# Patient Record
Sex: Female | Born: 1955 | Race: Black or African American | Hispanic: No | Smoking: Never smoker
Health system: Southern US, Community
[De-identification: ages and names within clinical notes are randomized; demographics above are authoritative.]

## PROBLEM LIST (undated history)

## (undated) DIAGNOSIS — F419 Anxiety disorder, unspecified: Secondary | ICD-10-CM

## (undated) DIAGNOSIS — E785 Hyperlipidemia, unspecified: Secondary | ICD-10-CM

## (undated) DIAGNOSIS — Z9889 Other specified postprocedural states: Secondary | ICD-10-CM

## (undated) DIAGNOSIS — C801 Malignant (primary) neoplasm, unspecified: Secondary | ICD-10-CM

## (undated) DIAGNOSIS — R112 Nausea with vomiting, unspecified: Secondary | ICD-10-CM

## (undated) DIAGNOSIS — J189 Pneumonia, unspecified organism: Secondary | ICD-10-CM

## (undated) DIAGNOSIS — M1990 Unspecified osteoarthritis, unspecified site: Secondary | ICD-10-CM

## (undated) DIAGNOSIS — K219 Gastro-esophageal reflux disease without esophagitis: Secondary | ICD-10-CM

## (undated) DIAGNOSIS — I10 Essential (primary) hypertension: Secondary | ICD-10-CM

## (undated) DIAGNOSIS — Z5189 Encounter for other specified aftercare: Secondary | ICD-10-CM

## (undated) HISTORY — DX: Encounter for other specified aftercare: Z51.89

## (undated) HISTORY — DX: Malignant (primary) neoplasm, unspecified: C80.1

## (undated) HISTORY — PX: BREAST SURGERY: SHX581

## (undated) HISTORY — PX: COLONOSCOPY: SHX174

## (undated) SURGERY — CYST REMOVAL
Anesthesia: LOCAL | Laterality: Left

---

## 2006-01-02 ENCOUNTER — Encounter: Admission: RE | Admit: 2006-01-02 | Discharge: 2006-01-02 | Payer: Self-pay | Admitting: Radiology

## 2006-01-05 ENCOUNTER — Ambulatory Visit (HOSPITAL_COMMUNITY): Admission: RE | Admit: 2006-01-05 | Discharge: 2006-01-05 | Payer: Self-pay | Admitting: General Surgery

## 2006-01-09 ENCOUNTER — Encounter (INDEPENDENT_AMBULATORY_CARE_PROVIDER_SITE_OTHER): Payer: Self-pay | Admitting: *Deleted

## 2006-01-09 ENCOUNTER — Ambulatory Visit (HOSPITAL_COMMUNITY): Admission: AD | Admit: 2006-01-09 | Discharge: 2006-01-10 | Payer: Self-pay | Admitting: General Surgery

## 2006-01-09 ENCOUNTER — Encounter (INDEPENDENT_AMBULATORY_CARE_PROVIDER_SITE_OTHER): Payer: Self-pay | Admitting: General Surgery

## 2006-01-14 ENCOUNTER — Emergency Department (HOSPITAL_COMMUNITY): Admission: EM | Admit: 2006-01-14 | Discharge: 2006-01-14 | Payer: Self-pay | Admitting: Emergency Medicine

## 2006-01-15 ENCOUNTER — Ambulatory Visit: Payer: Self-pay | Admitting: Oncology

## 2006-01-24 LAB — COMPREHENSIVE METABOLIC PANEL
ALT: 11 U/L (ref 0–40)
AST: 16 U/L (ref 0–37)
Albumin: 4.3 g/dL (ref 3.5–5.2)
Alkaline Phosphatase: 57 U/L (ref 39–117)
BUN: 11 mg/dL (ref 6–23)
CO2: 28 mEq/L (ref 19–32)
Calcium: 9.8 mg/dL (ref 8.4–10.5)
Chloride: 103 mEq/L (ref 96–112)
Creatinine, Ser: 0.6 mg/dL (ref 0.4–1.2)
Glucose, Bld: 85 mg/dL (ref 70–99)
Potassium: 4 mEq/L (ref 3.5–5.3)
Sodium: 142 mEq/L (ref 135–145)
Total Bilirubin: 0.4 mg/dL (ref 0.3–1.2)
Total Protein: 7.3 g/dL (ref 6.0–8.3)

## 2006-01-24 LAB — CBC WITH DIFFERENTIAL/PLATELET
BASO%: 0.4 % (ref 0.0–2.0)
Basophils Absolute: 0 10*3/uL (ref 0.0–0.1)
EOS%: 1.6 % (ref 0.0–7.0)
Eosinophils Absolute: 0.1 10*3/uL (ref 0.0–0.5)
HCT: 34.8 % (ref 34.8–46.6)
HGB: 11.9 g/dL (ref 11.6–15.9)
LYMPH%: 27.1 % (ref 14.0–48.0)
MCH: 30.1 pg (ref 26.0–34.0)
MCHC: 34.3 g/dL (ref 32.0–36.0)
MCV: 87.7 fL (ref 81.0–101.0)
MONO#: 0.4 10*3/uL (ref 0.1–0.9)
MONO%: 4.3 % (ref 0.0–13.0)
NEUT#: 6 10*3/uL (ref 1.5–6.5)
NEUT%: 66.6 % (ref 39.6–76.8)
Platelets: 323 10*3/uL (ref 145–400)
RBC: 3.96 10*6/uL (ref 3.70–5.32)
RDW: 13.1 % (ref 11.3–14.5)
WBC: 9 10*3/uL (ref 3.9–10.0)
lymph#: 2.4 10*3/uL (ref 0.9–3.3)

## 2006-01-24 LAB — CANCER ANTIGEN 27.29: CA 27.29: 27 U/mL (ref 0–39)

## 2006-01-24 LAB — LACTATE DEHYDROGENASE: LDH: 156 U/L (ref 94–250)

## 2006-01-26 ENCOUNTER — Ambulatory Visit (HOSPITAL_COMMUNITY): Admission: RE | Admit: 2006-01-26 | Discharge: 2006-01-26 | Payer: Self-pay | Admitting: Oncology

## 2006-01-31 ENCOUNTER — Ambulatory Visit (HOSPITAL_COMMUNITY): Admission: RE | Admit: 2006-01-31 | Discharge: 2006-01-31 | Payer: Self-pay | Admitting: Oncology

## 2006-02-05 ENCOUNTER — Ambulatory Visit (HOSPITAL_COMMUNITY): Admission: RE | Admit: 2006-02-05 | Discharge: 2006-02-05 | Payer: Self-pay | Admitting: General Surgery

## 2006-02-08 LAB — CBC WITH DIFFERENTIAL/PLATELET
BASO%: 0.6 % (ref 0.0–2.0)
Basophils Absolute: 0.1 10*3/uL (ref 0.0–0.1)
EOS%: 1.9 % (ref 0.0–7.0)
Eosinophils Absolute: 0.2 10*3/uL (ref 0.0–0.5)
HCT: 37.3 % (ref 34.8–46.6)
HGB: 13.1 g/dL (ref 11.6–15.9)
LYMPH%: 29 % (ref 14.0–48.0)
MCH: 30.2 pg (ref 26.0–34.0)
MCHC: 35 g/dL (ref 32.0–36.0)
MCV: 86.2 fL (ref 81.0–101.0)
MONO#: 0.6 10*3/uL (ref 0.1–0.9)
MONO%: 6.1 % (ref 0.0–13.0)
NEUT#: 5.8 10*3/uL (ref 1.5–6.5)
NEUT%: 62.4 % (ref 39.6–76.8)
Platelets: 283 10*3/uL (ref 145–400)
RBC: 4.33 10*6/uL (ref 3.70–5.32)
RDW: 12.4 % (ref 11.3–14.5)
WBC: 9.3 10*3/uL (ref 3.9–10.0)
lymph#: 2.7 10*3/uL (ref 0.9–3.3)

## 2006-02-16 LAB — CBC WITH DIFFERENTIAL/PLATELET
BASO%: 1.2 % (ref 0.0–2.0)
Basophils Absolute: 0.2 10*3/uL — ABNORMAL HIGH (ref 0.0–0.1)
EOS%: 0.3 % (ref 0.0–7.0)
Eosinophils Absolute: 0 10*3/uL (ref 0.0–0.5)
HCT: 37.6 % (ref 34.8–46.6)
HGB: 13.4 g/dL (ref 11.6–15.9)
LYMPH%: 22.5 % (ref 14.0–48.0)
MCH: 30.2 pg (ref 26.0–34.0)
MCHC: 35.7 g/dL (ref 32.0–36.0)
MCV: 84.5 fL (ref 81.0–101.0)
MONO#: 1.1 10*3/uL — ABNORMAL HIGH (ref 0.1–0.9)
MONO%: 6.1 % (ref 0.0–13.0)
NEUT#: 12.2 10*3/uL — ABNORMAL HIGH (ref 1.5–6.5)
NEUT%: 70 % (ref 39.6–76.8)
Platelets: 261 10*3/uL (ref 145–400)
RBC: 4.45 10*6/uL (ref 3.70–5.32)
RDW: 11.5 % (ref 11.3–14.5)
WBC: 17.5 10*3/uL — ABNORMAL HIGH (ref 3.9–10.0)
lymph#: 3.9 10*3/uL — ABNORMAL HIGH (ref 0.9–3.3)

## 2006-02-22 LAB — CBC WITH DIFFERENTIAL/PLATELET
BASO%: 0.7 % (ref 0.0–2.0)
Basophils Absolute: 0.1 10*3/uL (ref 0.0–0.1)
EOS%: 0.1 % (ref 0.0–7.0)
Eosinophils Absolute: 0 10*3/uL (ref 0.0–0.5)
HCT: 32.6 % — ABNORMAL LOW (ref 34.8–46.6)
HGB: 11.6 g/dL (ref 11.6–15.9)
LYMPH%: 21.5 % (ref 14.0–48.0)
MCH: 30.6 pg (ref 26.0–34.0)
MCHC: 35.4 g/dL (ref 32.0–36.0)
MCV: 86.4 fL (ref 81.0–101.0)
MONO#: 0.5 10*3/uL (ref 0.1–0.9)
MONO%: 4.4 % (ref 0.0–13.0)
NEUT#: 8.4 10*3/uL — ABNORMAL HIGH (ref 1.5–6.5)
NEUT%: 73.3 % (ref 39.6–76.8)
Platelets: 262 10*3/uL (ref 145–400)
RBC: 3.77 10*6/uL (ref 3.70–5.32)
RDW: 12.3 % (ref 11.3–14.5)
WBC: 11.5 10*3/uL — ABNORMAL HIGH (ref 3.9–10.0)
lymph#: 2.5 10*3/uL (ref 0.9–3.3)

## 2006-02-28 LAB — CBC WITH DIFFERENTIAL/PLATELET
BASO%: 0.6 % (ref 0.0–2.0)
Basophils Absolute: 0.1 10*3/uL (ref 0.0–0.1)
EOS%: 0.4 % (ref 0.0–7.0)
Eosinophils Absolute: 0 10*3/uL (ref 0.0–0.5)
HCT: 32 % — ABNORMAL LOW (ref 34.8–46.6)
HGB: 11.4 g/dL — ABNORMAL LOW (ref 11.6–15.9)
LYMPH%: 21.3 % (ref 14.0–48.0)
MCH: 30.5 pg (ref 26.0–34.0)
MCHC: 35.7 g/dL (ref 32.0–36.0)
MCV: 85.4 fL (ref 81.0–101.0)
MONO#: 0.8 10*3/uL (ref 0.1–0.9)
MONO%: 5.9 % (ref 0.0–13.0)
NEUT#: 9.7 10*3/uL — ABNORMAL HIGH (ref 1.5–6.5)
NEUT%: 71.8 % (ref 39.6–76.8)
Platelets: 274 10*3/uL (ref 145–400)
RBC: 3.75 10*6/uL (ref 3.70–5.32)
RDW: 12.5 % (ref 11.3–14.5)
WBC: 13.4 10*3/uL — ABNORMAL HIGH (ref 3.9–10.0)
lymph#: 2.9 10*3/uL (ref 0.9–3.3)

## 2006-03-02 ENCOUNTER — Ambulatory Visit: Payer: Self-pay | Admitting: Oncology

## 2006-03-07 LAB — CBC WITH DIFFERENTIAL/PLATELET
BASO%: 0.2 % (ref 0.0–2.0)
Basophils Absolute: 0 10*3/uL (ref 0.0–0.1)
EOS%: 0.1 % (ref 0.0–7.0)
Eosinophils Absolute: 0 10*3/uL (ref 0.0–0.5)
HCT: 37.5 % (ref 34.8–46.6)
HGB: 12.8 g/dL (ref 11.6–15.9)
LYMPH%: 21.6 % (ref 14.0–48.0)
MCH: 30 pg (ref 26.0–34.0)
MCHC: 34.2 g/dL (ref 32.0–36.0)
MCV: 87.7 fL (ref 81.0–101.0)
MONO#: 0.2 10*3/uL (ref 0.1–0.9)
MONO%: 1.1 % (ref 0.0–13.0)
NEUT#: 10.5 10*3/uL — ABNORMAL HIGH (ref 1.5–6.5)
NEUT%: 77 % — ABNORMAL HIGH (ref 39.6–76.8)
Platelets: 183 10*3/uL (ref 145–400)
RBC: 4.28 10*6/uL (ref 3.70–5.32)
RDW: 13.2 % (ref 11.3–14.5)
WBC: 13.6 10*3/uL — ABNORMAL HIGH (ref 3.9–10.0)
lymph#: 2.9 10*3/uL (ref 0.9–3.3)

## 2006-03-22 LAB — CBC WITH DIFFERENTIAL/PLATELET
BASO%: 0.1 % (ref 0.0–2.0)
Basophils Absolute: 0 10*3/uL (ref 0.0–0.1)
EOS%: 0 % (ref 0.0–7.0)
Eosinophils Absolute: 0 10*3/uL (ref 0.0–0.5)
HCT: 33.1 % — ABNORMAL LOW (ref 34.8–46.6)
HGB: 11.6 g/dL (ref 11.6–15.9)
LYMPH%: 8 % — ABNORMAL LOW (ref 14.0–48.0)
MCH: 30.3 pg (ref 26.0–34.0)
MCHC: 35 g/dL (ref 32.0–36.0)
MCV: 86.5 fL (ref 81.0–101.0)
MONO#: 0.1 10*3/uL (ref 0.1–0.9)
MONO%: 0.8 % (ref 0.0–13.0)
NEUT#: 11.2 10*3/uL — ABNORMAL HIGH (ref 1.5–6.5)
NEUT%: 91.1 % — ABNORMAL HIGH (ref 39.6–76.8)
Platelets: 253 10*3/uL (ref 145–400)
RBC: 3.83 10*6/uL (ref 3.70–5.32)
RDW: 13.9 % (ref 11.3–14.5)
WBC: 12.3 10*3/uL — ABNORMAL HIGH (ref 3.9–10.0)
lymph#: 1 10*3/uL (ref 0.9–3.3)

## 2006-03-30 LAB — CBC WITH DIFFERENTIAL/PLATELET
BASO%: 1.1 % (ref 0.0–2.0)
Basophils Absolute: 0.1 10*3/uL (ref 0.0–0.1)
EOS%: 0.1 % (ref 0.0–7.0)
Eosinophils Absolute: 0 10*3/uL (ref 0.0–0.5)
HCT: 33.5 % — ABNORMAL LOW (ref 34.8–46.6)
HGB: 11.5 g/dL — ABNORMAL LOW (ref 11.6–15.9)
LYMPH%: 34.9 % (ref 14.0–48.0)
MCH: 29.9 pg (ref 26.0–34.0)
MCHC: 34.2 g/dL (ref 32.0–36.0)
MCV: 87.5 fL (ref 81.0–101.0)
MONO#: 0.6 10*3/uL (ref 0.1–0.9)
MONO%: 7.6 % (ref 0.0–13.0)
NEUT#: 4.3 10*3/uL (ref 1.5–6.5)
NEUT%: 56.4 % (ref 39.6–76.8)
Platelets: 269 10*3/uL (ref 145–400)
RBC: 3.83 10*6/uL (ref 3.70–5.32)
RDW: 13.5 % (ref 11.3–14.5)
WBC: 7.6 10*3/uL (ref 3.9–10.0)
lymph#: 2.7 10*3/uL (ref 0.9–3.3)

## 2006-04-06 LAB — CBC WITH DIFFERENTIAL/PLATELET
BASO%: 0.4 % (ref 0.0–2.0)
Basophils Absolute: 0 10*3/uL (ref 0.0–0.1)
EOS%: 0 % (ref 0.0–7.0)
Eosinophils Absolute: 0 10*3/uL (ref 0.0–0.5)
HCT: 32.3 % — ABNORMAL LOW (ref 34.8–46.6)
HGB: 11 g/dL — ABNORMAL LOW (ref 11.6–15.9)
LYMPH%: 24.2 % (ref 14.0–48.0)
MCH: 30.1 pg (ref 26.0–34.0)
MCHC: 34.2 g/dL (ref 32.0–36.0)
MCV: 88 fL (ref 81.0–101.0)
MONO#: 0.4 10*3/uL (ref 0.1–0.9)
MONO%: 4.9 % (ref 0.0–13.0)
NEUT#: 5.6 10*3/uL (ref 1.5–6.5)
NEUT%: 70.5 % (ref 39.6–76.8)
Platelets: 273 10*3/uL (ref 145–400)
RBC: 3.67 10*6/uL — ABNORMAL LOW (ref 3.70–5.32)
RDW: 15.7 % — ABNORMAL HIGH (ref 11.3–14.5)
WBC: 8 10*3/uL (ref 3.9–10.0)
lymph#: 1.9 10*3/uL (ref 0.9–3.3)

## 2006-04-06 LAB — COMPREHENSIVE METABOLIC PANEL
ALT: 26 U/L (ref 0–40)
AST: 16 U/L (ref 0–37)
Albumin: 4.2 g/dL (ref 3.5–5.2)
Alkaline Phosphatase: 82 U/L (ref 39–117)
BUN: 7 mg/dL (ref 6–23)
CO2: 26 mEq/L (ref 19–32)
Calcium: 8.9 mg/dL (ref 8.4–10.5)
Chloride: 104 mEq/L (ref 96–112)
Creatinine, Ser: 0.51 mg/dL (ref 0.40–1.20)
Glucose, Bld: 89 mg/dL (ref 70–99)
Potassium: 3.7 mEq/L (ref 3.5–5.3)
Sodium: 141 mEq/L (ref 135–145)
Total Bilirubin: 0.5 mg/dL (ref 0.3–1.2)
Total Protein: 6.7 g/dL (ref 6.0–8.3)

## 2006-04-06 LAB — CANCER ANTIGEN 27.29: CA 27.29: 41 U/mL — ABNORMAL HIGH (ref 0–39)

## 2006-04-13 ENCOUNTER — Ambulatory Visit: Payer: Self-pay | Admitting: Oncology

## 2006-04-13 LAB — CBC WITH DIFFERENTIAL/PLATELET
BASO%: 0.3 % (ref 0.0–2.0)
Basophils Absolute: 0.1 10*3/uL (ref 0.0–0.1)
EOS%: 0 % (ref 0.0–7.0)
Eosinophils Absolute: 0 10*3/uL (ref 0.0–0.5)
HCT: 33.3 % — ABNORMAL LOW (ref 34.8–46.6)
HGB: 11.6 g/dL (ref 11.6–15.9)
LYMPH%: 9.8 % — ABNORMAL LOW (ref 14.0–48.0)
MCH: 30.6 pg (ref 26.0–34.0)
MCHC: 34.9 g/dL (ref 32.0–36.0)
MCV: 87.7 fL (ref 81.0–101.0)
MONO#: 0.8 10*3/uL (ref 0.1–0.9)
MONO%: 4.1 % (ref 0.0–13.0)
NEUT#: 16.6 10*3/uL — ABNORMAL HIGH (ref 1.5–6.5)
NEUT%: 85.8 % — ABNORMAL HIGH (ref 39.6–76.8)
Platelets: 301 10*3/uL (ref 145–400)
RBC: 3.79 10*6/uL (ref 3.70–5.32)
RDW: 14.6 % — ABNORMAL HIGH (ref 11.3–14.5)
WBC: 19.4 10*3/uL — ABNORMAL HIGH (ref 3.9–10.0)
lymph#: 1.9 10*3/uL (ref 0.9–3.3)

## 2006-05-18 ENCOUNTER — Encounter: Payer: Self-pay | Admitting: Vascular Surgery

## 2006-05-18 ENCOUNTER — Ambulatory Visit (HOSPITAL_COMMUNITY): Admission: RE | Admit: 2006-05-18 | Discharge: 2006-05-18 | Payer: Self-pay | Admitting: Oncology

## 2006-05-18 LAB — CANCER ANTIGEN 27.29: CA 27.29: 43 U/mL — ABNORMAL HIGH (ref 0–39)

## 2006-05-18 LAB — CBC WITH DIFFERENTIAL/PLATELET
BASO%: 0.2 % (ref 0.0–2.0)
Basophils Absolute: 0 10*3/uL (ref 0.0–0.1)
EOS%: 0.9 % (ref 0.0–7.0)
Eosinophils Absolute: 0.1 10*3/uL (ref 0.0–0.5)
HCT: 33 % — ABNORMAL LOW (ref 34.8–46.6)
HGB: 11.4 g/dL — ABNORMAL LOW (ref 11.6–15.9)
LYMPH%: 31 % (ref 14.0–48.0)
MCH: 31.3 pg (ref 26.0–34.0)
MCHC: 34.4 g/dL (ref 32.0–36.0)
MCV: 90.8 fL (ref 81.0–101.0)
MONO#: 0.6 10*3/uL (ref 0.1–0.9)
MONO%: 8.6 % (ref 0.0–13.0)
NEUT#: 3.8 10*3/uL (ref 1.5–6.5)
NEUT%: 59.3 % (ref 39.6–76.8)
Platelets: 361 10*3/uL (ref 145–400)
RBC: 3.64 10*6/uL — ABNORMAL LOW (ref 3.70–5.32)
RDW: 17.1 % — ABNORMAL HIGH (ref 11.3–14.5)
WBC: 6.4 10*3/uL (ref 3.9–10.0)
lymph#: 2 10*3/uL (ref 0.9–3.3)

## 2006-05-18 LAB — COMPREHENSIVE METABOLIC PANEL
ALT: 14 U/L (ref 0–40)
AST: 16 U/L (ref 0–37)
Albumin: 4.4 g/dL (ref 3.5–5.2)
Alkaline Phosphatase: 59 U/L (ref 39–117)
BUN: 12 mg/dL (ref 6–23)
CO2: 30 mEq/L (ref 19–32)
Calcium: 9.6 mg/dL (ref 8.4–10.5)
Chloride: 102 mEq/L (ref 96–112)
Creatinine, Ser: 0.64 mg/dL (ref 0.40–1.20)
Glucose, Bld: 103 mg/dL — ABNORMAL HIGH (ref 70–99)
Potassium: 4.4 mEq/L (ref 3.5–5.3)
Sodium: 142 mEq/L (ref 135–145)
Total Bilirubin: 0.6 mg/dL (ref 0.3–1.2)
Total Protein: 6.7 g/dL (ref 6.0–8.3)

## 2006-06-26 ENCOUNTER — Ambulatory Visit: Payer: Self-pay | Admitting: Oncology

## 2006-06-28 LAB — CBC WITH DIFFERENTIAL/PLATELET
BASO%: 0.4 % (ref 0.0–2.0)
Basophils Absolute: 0 10*3/uL (ref 0.0–0.1)
EOS%: 1.5 % (ref 0.0–7.0)
Eosinophils Absolute: 0.1 10*3/uL (ref 0.0–0.5)
HCT: 34.6 % — ABNORMAL LOW (ref 34.8–46.6)
HGB: 11.9 g/dL (ref 11.6–15.9)
LYMPH%: 31.5 % (ref 14.0–48.0)
MCH: 30.6 pg (ref 26.0–34.0)
MCHC: 34.4 g/dL (ref 32.0–36.0)
MCV: 89 fL (ref 81.0–101.0)
MONO#: 0.4 10*3/uL (ref 0.1–0.9)
MONO%: 6.1 % (ref 0.0–13.0)
NEUT#: 4.3 10*3/uL (ref 1.5–6.5)
NEUT%: 60.5 % (ref 39.6–76.8)
Platelets: 253 10*3/uL (ref 145–400)
RBC: 3.89 10*6/uL (ref 3.70–5.32)
RDW: 13.4 % (ref 11.3–14.5)
WBC: 7.1 10*3/uL (ref 3.9–10.0)
lymph#: 2.2 10*3/uL (ref 0.9–3.3)

## 2006-06-28 LAB — CANCER ANTIGEN 27.29: CA 27.29: 27 U/mL (ref 0–39)

## 2006-08-06 ENCOUNTER — Ambulatory Visit (HOSPITAL_COMMUNITY): Admission: RE | Admit: 2006-08-06 | Discharge: 2006-08-06 | Payer: Self-pay | Admitting: Oncology

## 2006-08-09 ENCOUNTER — Ambulatory Visit: Payer: Self-pay | Admitting: Oncology

## 2007-01-21 ENCOUNTER — Ambulatory Visit (HOSPITAL_BASED_OUTPATIENT_CLINIC_OR_DEPARTMENT_OTHER): Admission: RE | Admit: 2007-01-21 | Discharge: 2007-01-21 | Payer: Self-pay | Admitting: General Surgery

## 2009-01-08 ENCOUNTER — Encounter: Admission: RE | Admit: 2009-01-08 | Discharge: 2009-01-08 | Payer: Self-pay | Admitting: Family Medicine

## 2010-11-13 ENCOUNTER — Encounter: Payer: Self-pay | Admitting: Radiology

## 2011-03-10 NOTE — Op Note (Signed)
NAMERODINA, PINALES             ACCOUNT NO.:  [id number]   MEDICAL RECORD NO.:  [id number]          PATIENT TYPE:  AMB   LOCATION:  DAY                          FACILITY:  [facility]   PHYSICIAN:  Sharlet Salina T. Hoxworth, M.D.DATE OF BIRTH:  Mar 17, 1956   DATE OF PROCEDURE:  02/05/2006  DATE OF DISCHARGE:                                 OPERATIVE REPORT   PRE AND POSTOPERATIVE DIAGNOSIS:  Cancer of breast with poor venous access.   SURGICAL PROCEDURES:  Placement of Life Port to the left subclavian vein.   SURGEON:  Lorne Skeens. Hoxworth, M.D.   ANESTHESIA:  Local with IV sedation.   BRIEF HISTORY:  Ms. Drawdy is a 55 year old female with a recent diagnosis  of breast cancer who will require long-term venous access for chemotherapy.  She has very poor peripheral access.  Placement of subcutaneous venous port  has been recommended and accepted.  The nature of the procedure,  indications, risks of bleeding, infection, pneumothorax, thrombosis,  catheter displacement and malfunction have been discussed and understood.  She is now brought to the operating room for this procedure.   DESCRIPTION OF PROCEDURE:  The patient was brought to the operating room and  placed supine position on operating table.  IV sedation was administered.  The left neck, chest and shoulder were sterilely prepped and draped.  She  received preoperative IV antibiotics.  Correct patient procedure were  verified.  Local anesthesia was used infiltrate the infraclavicular area on  the left and the left subclavian vein cannulated with a needle and guidewire  confirmed by fluoroscopy.  The introducer was then easily passed over the  guidewire and the guidewire removed and the flushed catheter placed via the  introducer which was stripped away.  The catheter tip was positioned in the  superior vena cava by fluoroscopy.  The patient had a previous port on the  left chest and the same incision was used under local anesthesia  and  dissection carried down the subcutaneous tissue and a small subcutaneous  pocket created.  The catheter was tunneled subcutaneously to the pocket,  trimmed to length and attached to the port which was then sutured the  anterior chest wall with interrupted 2-0 Prolene.  Skin incisions were  closed interrupted subcuticular 4-0 Monocryl and Steri-Strips.  The catheter  was accessed, flushed and aspirated easily, was left flushed with  concentrated heparin.  Sponge, needle and instrument counts were correct.  Dry sterile dressings applied.  The patient taken recovery in good  condition.      Lorne Skeens. Hoxworth, M.D.  Electronically Signed     BTH/MEDQ  D:  02/05/2006  T:  02/06/2006  Job:  [id number]

## 2011-03-10 NOTE — Op Note (Signed)
NAMEDAMONIE, FURNEY             ACCOUNT NO.:  [id number]   MEDICAL RECORD NO.:  [id number]          PATIENT TYPE:  OIB   LOCATION:  5742                         FACILITY:  [facility]   PHYSICIAN:  Sharlet Salina T. Hoxworth, M.D.DATE OF BIRTH:  Jul 11, 1956   DATE OF PROCEDURE:  01/09/2006  DATE OF DISCHARGE:                                 OPERATIVE REPORT   PREOPERATIVE DIAGNOSIS:  Carcinoma right breast.   POSTOPERATIVE DIAGNOSIS:  Carcinoma right breast.   SURGICAL PROCEDURES:  Right total mastectomy.   SURGEON:  Lorne Skeens. Hoxworth, M.D.   ANESTHESIA:  General.   BRIEF HISTORY:  Amy Guzman is a 55 year old female who has a history of  previous right breast cancer treated with lumpectomy, full axillary  dissection, radiotherapy and chemotherapy 10 years ago. She had had no  evidence of local or distant recurrence on long term follow-up and had been  receiving routine screening mammograms. Recent screening mammogram showed a  new small mass in the lower inner quadrant of the right breast and large  core needle biopsy has revealed poorly differentiated invasive mammary  carcinoma. Due to her history of previous lumpectomy and radiation with  axillary dissection, we have recommended proceeding with right total  mastectomy. She has declined reconstruction at this time. The nature of the  procedure, indications, risks of bleeding, infection, wound healing problems  were discussed and understood. She is now brought to the operating room for  this procedure.   DESCRIPTION OF OPERATION:  The patient was brought to the operating room,  placed in supine position on the operating table and general endotracheal  anesthesia was induced. Her arm was carefully positioned, extended and the  right arm, neck and chest were widely sterilely prepped and draped. She had  received preoperative IV antibiotics. Correct patient and procedure were  verified. An elliptical incision was used encompassing  her previous fairly  large and high transverse breast incision superiorly and down to the core  biopsy site inferiorly which was inferiorly in the inner breast. Skin and  subcutaneous flaps were then developed superiorly up the clavicle, medially  to the edge of the sternum, inferiorly to the origin of the rectus sheath  and laterally out to the anterior border of the latissimus dorsi. The breast  was then reflected up off of the chest wall with cautery. Some perforating  vessels were suture ligated. Dissection continued out laterally off of the  serratus and the lateral edge of the pectoralis was freed. Further  attachment along the serratus and latissimus were freed with cautery and the  dissection was carried up toward the axilla completely freeing the  pectoralis major and minor and dissecting up to the lower level of the  previous full axillary dissection. The specimen was divided at this point  between clamps and with cautery and removed and these tied with 3-0 silks.  The wound was then copiously irrigated and hemostasis assured. A closed  suction drain was left through a separate stab wound and placed under the  flaps and through the axilla. The skin was  then closed with staples. There was some  tension but not excessive due to  the incision encompassing the previous excision site and core biopsy sites.  A dry sterile dressing was applied. Sponge, needle and instrument counts  were correct. The patient was taken to recovery in good condition.      Lorne Skeens. Hoxworth, M.D.  Electronically Signed     BTH/MEDQ  D:  01/09/2006  T:  01/10/2006  Job:  [id number]

## 2012-08-09 ENCOUNTER — Other Ambulatory Visit: Payer: Self-pay | Admitting: Ophthalmology

## 2012-08-09 ENCOUNTER — Encounter (HOSPITAL_BASED_OUTPATIENT_CLINIC_OR_DEPARTMENT_OTHER): Payer: Self-pay | Admitting: *Deleted

## 2012-08-09 ENCOUNTER — Ambulatory Visit (HOSPITAL_BASED_OUTPATIENT_CLINIC_OR_DEPARTMENT_OTHER)
Admission: RE | Admit: 2012-08-09 | Discharge: 2012-08-09 | Disposition: A | Discharge status: Home / Self Care | Payer: BC Managed Care – PPO | Source: Ambulatory Visit | Attending: Ophthalmology | Admitting: Ophthalmology

## 2012-08-09 ENCOUNTER — Encounter (HOSPITAL_BASED_OUTPATIENT_CLINIC_OR_DEPARTMENT_OTHER)
Admission: RE | Disposition: A | Discharge status: Home / Self Care | Payer: Self-pay | Source: Ambulatory Visit | Attending: Ophthalmology

## 2012-08-09 ENCOUNTER — Encounter: Payer: Self-pay | Admitting: Ophthalmology

## 2012-08-09 DIAGNOSIS — H02829 Cysts of unspecified eye, unspecified eyelid: Secondary | ICD-10-CM | POA: Insufficient documentation

## 2012-08-09 DIAGNOSIS — D2310 Other benign neoplasm of skin of unspecified eyelid, including canthus: Secondary | ICD-10-CM | POA: Insufficient documentation

## 2012-08-09 HISTORY — DX: Hyperlipidemia, unspecified: E78.5

## 2012-08-09 HISTORY — DX: Essential (primary) hypertension: I10

## 2012-08-09 HISTORY — PX: CHALAZION EXCISION: SHX213

## 2012-08-09 SURGERY — MINOR EXCISION OF CHALAZION
Anesthesia: LOCAL | Site: Eye | Laterality: Right | Wound class: Clean Contaminated

## 2012-08-09 MED ORDER — LIDOCAINE HCL 1 % IJ SOLN
INTRAMUSCULAR | Status: DC | PRN
  Administered 2012-08-09: 3 mL

## 2012-08-09 SURGICAL SUPPLY — 18 items
APPLICATOR COTTON TIP 6IN STRL (MISCELLANEOUS) ×2 IMPLANT
BLADE SURG 11 STRL SS (BLADE) ×2 IMPLANT
CAUTERY EYE LOW TEMP 1300F FIN (OPHTHALMIC RELATED) IMPLANT
CLOTH BEACON ORANGE TIMEOUT ST (SAFETY) ×2 IMPLANT
GAUZE SPONGE 4X4 12PLY STRL LF (GAUZE/BANDAGES/DRESSINGS) ×3 IMPLANT
GLOVE BIO SURGEON STRL SZ7 (GLOVE) ×2 IMPLANT
GLOVE ECLIPSE 7.0 STRL STRAW (GLOVE) ×2 IMPLANT
MARKER SKIN DUAL TIP RULER LAB (MISCELLANEOUS) ×2 IMPLANT
NDL HYPO 25X5/8 SAFETYGLIDE (NEEDLE) ×1 IMPLANT
NDL SAFETY ECLIPSE 18X1.5 (NEEDLE) ×1 IMPLANT
NEEDLE HYPO 18GX1.5 SHARP (NEEDLE) ×2
NEEDLE HYPO 25X5/8 SAFETYGLIDE (NEEDLE) ×2 IMPLANT
PAD ALCOHOL SWAB (MISCELLANEOUS) ×3 IMPLANT
PAD EYE OVAL STERILE LF (GAUZE/BANDAGES/DRESSINGS) ×4 IMPLANT
SUT SILK 6 0 P 1 (SUTURE) IMPLANT
SWABSTICK POVIDONE IODINE SNGL (MISCELLANEOUS) ×4 IMPLANT
SYR CONTROL 10ML LL (SYRINGE) ×2 IMPLANT
TOWEL OR 17X24 6PK STRL BLUE (TOWEL DISPOSABLE) ×2 IMPLANT

## 2012-08-09 NOTE — Brief Op Note (Signed)
08/09/2012  7:25 AM  PATIENT:  Amy Guzman  56 y.o. female  PRE-OPERATIVE DIAGNOSIS:  cyst lower lid right eye  POST-OPERATIVE DIAGNOSIS:  * same *  PROCEDURE:  Excision cyst lower lid right eye  SURGEON:  Surgeon(s) and Role:    Vita Erm., MD - Primary  PHYSICIAN ASSISTANT:   ASSISTANTS: none  ANESTHESIA:   Lidocaine 1%  EBL:     BLOOD ADMINISTERED:none  DRAINS: none   LOCAL MEDICATIONS USED:  LIDOCAINE   SPECIMEN:  No Specimen  DISPOSITION OF SPECIMEN:  N/A  COUNTS:  YES  TOURNIQUET:  * No tourniquets in log *  DICTATION: .Note written in EPIC  PLAN OF CARE: Discharge to home after PACU  PATIENT DISPOSITION:  PACU - hemodynamically stable.   Delay start of Pharmacological VTE agent (>24hrs) due to surgical blood loss or risk of bleeding: not applicable

## 2012-08-09 NOTE — H&P (Signed)
  56 yo female has multiple cystic lesions of the lower lid right eye and the upper and lower lids left eye.  The lesion on the lower lid is larger and seems t o be increasing in size.  The patient is admitted for excision of the larger lesion of the lower lid right eye.

## 2012-08-12 ENCOUNTER — Encounter (HOSPITAL_BASED_OUTPATIENT_CLINIC_OR_DEPARTMENT_OTHER): Payer: Self-pay | Admitting: Ophthalmology

## 2012-08-12 ENCOUNTER — Encounter (HOSPITAL_BASED_OUTPATIENT_CLINIC_OR_DEPARTMENT_OTHER): Payer: Self-pay

## 2012-08-16 NOTE — Op Note (Signed)
Amy Guzman, Amy Guzman            ACCOUNT NO.:  [id number]  MEDICAL RECORD NO.:  [id number]  LOCATION:                                 FACILITY:  PHYSICIAN:  Salley Scarlet., M.D.DATE OF BIRTH:  [birth year]  DATE OF PROCEDURE:  08/09/2012 DATE OF DISCHARGE:                              OPERATIVE REPORT   PREOPERATIVE DIAGNOSIS:  Multiple cysts lower lid, right eye.  POSTOPERATIVE DIAGNOSIS:  Cyst excision lower lid, right eye.  ANESTHESIA:  Local using Xylocaine 1%.  JUSTIFICATION FOR PROCEDURE:  This is a 56 year old lady who has multiple cysts along the lower and upper lid of the right eye.  She also has multiple cysts of the lids of the left eye.  The lesion on the right are becoming more numerous and are increasing in size.  The patient requested that the larger lesion be removed.  She is admitted for the proposed procedure.  PROCEDURE:  The patient was brought to the minor surgery room and she was prepped and draped in the usual manner.  The lid adjacent and surrounding the larger lesion which was located at the lateral aspect of the lower lid of the right eye was infiltrated with several mL of Xylocaine. There were also smaller lesions along the lateral canthal area.  This area was also infiltrated with Xylocaine.  Then the chalazion clamp was applied over the larger lesions laterally for support.  A curvilinear incision was made around this larger lesion, which was located at the lid margin, being careful not to invade the lash line.  The lesion was excised in toto using sharp and blunt dissection.  The skin was reapproximated using interrupted sutures of 6-0 silk.  Then attention was applied to the lateral canthal area.  An elliptical section of skin containing the cyst were excised in toto using sharp and blunt dissection.  The skin was then re approximated using 6 0 silk sutures. All specimen were submitted to Pathology for study.  At the end of procedure,  Polysporin ophthalmic ointment and a pressure patch were applied.  The patient tolerated procedure well and was discharged to the post anesthesia recovery room in a satisfactory condition.  She was instructed to take Vicodin every 4 hours as needed for pain, to remove the patch tomorrow and to apply topical Polysporin ointment to the surgical site twice a day until I see her in the office in 1 week.  DISCHARGE DIAGNOSIS:  Multiple cysts, lower lid right eye.     Salley Scarlet., M.D.     TB/MEDQ  D:  08/16/2012  T:  08/16/2012  Job:  [id number]

## 2012-08-28 NOTE — Progress Notes (Signed)
Left message with preop instructions 30 min early Please call for questions

## 2012-08-30 ENCOUNTER — Other Ambulatory Visit: Payer: Self-pay | Admitting: Ophthalmology

## 2012-08-30 ENCOUNTER — Encounter (HOSPITAL_BASED_OUTPATIENT_CLINIC_OR_DEPARTMENT_OTHER)
Admission: RE | Disposition: A | Discharge status: Home / Self Care | Payer: Self-pay | Source: Ambulatory Visit | Attending: Ophthalmology

## 2012-08-30 ENCOUNTER — Encounter: Payer: Self-pay | Admitting: Ophthalmology

## 2012-08-30 ENCOUNTER — Ambulatory Visit (HOSPITAL_BASED_OUTPATIENT_CLINIC_OR_DEPARTMENT_OTHER)
Admission: RE | Admit: 2012-08-30 | Discharge: 2012-08-30 | Disposition: A | Discharge status: Home / Self Care | Payer: BC Managed Care – PPO | Source: Ambulatory Visit | Attending: Ophthalmology | Admitting: Ophthalmology

## 2012-08-30 DIAGNOSIS — Z79899 Other long term (current) drug therapy: Secondary | ICD-10-CM | POA: Insufficient documentation

## 2012-08-30 DIAGNOSIS — I10 Essential (primary) hypertension: Secondary | ICD-10-CM | POA: Insufficient documentation

## 2012-08-30 DIAGNOSIS — H02829 Cysts of unspecified eye, unspecified eyelid: Secondary | ICD-10-CM | POA: Insufficient documentation

## 2012-08-30 DIAGNOSIS — E7800 Pure hypercholesterolemia, unspecified: Secondary | ICD-10-CM | POA: Insufficient documentation

## 2012-08-30 DIAGNOSIS — H2510 Age-related nuclear cataract, unspecified eye: Secondary | ICD-10-CM | POA: Insufficient documentation

## 2012-08-30 HISTORY — PX: LESION REMOVAL: SHX5196

## 2012-08-30 SURGERY — MINOR EXCISION OF LESION
Anesthesia: LOCAL | Site: Face | Laterality: Left | Wound class: Clean Contaminated

## 2012-08-30 MED ORDER — LIDOCAINE HCL 1 % IJ SOLN
INTRAMUSCULAR | Status: DC | PRN
  Administered 2012-08-30: 4 mL

## 2012-08-30 MED ORDER — BACITRACIN-POLYMYXIN B 500-10000 UNIT/GM OP OINT
TOPICAL_OINTMENT | OPHTHALMIC | Status: DC | PRN
  Administered 2012-08-30: 1 via OPHTHALMIC

## 2012-08-30 SURGICAL SUPPLY — 17 items
APPLICATOR COTTON TIP 6IN STRL (MISCELLANEOUS) ×2 IMPLANT
BLADE SURG 11 STRL SS (BLADE) ×2 IMPLANT
CAUTERY EYE LOW TEMP 1300F FIN (OPHTHALMIC RELATED) IMPLANT
CLOTH BEACON ORANGE TIMEOUT ST (SAFETY) ×2 IMPLANT
GAUZE SPONGE 4X4 12PLY STRL LF (GAUZE/BANDAGES/DRESSINGS) ×2 IMPLANT
GLOVE ECLIPSE 7.0 STRL STRAW (GLOVE) ×3 IMPLANT
MARKER SKIN DUAL TIP RULER LAB (MISCELLANEOUS) ×2 IMPLANT
NDL HYPO 25X5/8 SAFETYGLIDE (NEEDLE) ×1 IMPLANT
NDL SAFETY ECLIPSE 18X1.5 (NEEDLE) ×1 IMPLANT
NEEDLE HYPO 18GX1.5 SHARP (NEEDLE) ×2
NEEDLE HYPO 25X5/8 SAFETYGLIDE (NEEDLE) ×2 IMPLANT
PAD ALCOHOL SWAB (MISCELLANEOUS) ×2 IMPLANT
PAD EYE OVAL STERILE LF (GAUZE/BANDAGES/DRESSINGS) ×4 IMPLANT
SUT SILK 6 0 P 1 (SUTURE) ×1 IMPLANT
SWABSTICK POVIDONE IODINE SNGL (MISCELLANEOUS) ×4 IMPLANT
SYR CONTROL 10ML LL (SYRINGE) ×2 IMPLANT
TOWEL OR 17X24 6PK STRL BLUE (TOWEL DISPOSABLE) ×2 IMPLANT

## 2012-08-30 NOTE — H&P (Signed)
  56 yo female has multiple cysts upper and lower lids left eye.  These lesions seem to be increasing in size.  She is admitted for cyst excisions lids left eye.  She underwent a similar procedure for excision cysts right eye 2-3 weeks ago.

## 2012-08-30 NOTE — Brief Op Note (Signed)
08/30/2012  7:27 AM  PATIENT:  Amy Guzman  56 y.o. female  PRE-OPERATIVE DIAGNOSIS: multiple cysts upper and lower lids left eye  POST-OPERATIVE DIAGNOSIS:  * Same *  PROCEDURE:  Procedure(s) (LRB) with comments: MINOR EXICISION OF LESION (Right)  SURGEON:  Surgeon(s) and Role:    Vita Erm., MD - Primary  PHYSICIAN ASSISTANT:   ASSISTANTS: none   ANESTHESIA:   local and regional  EBL:     BLOOD ADMINISTERED:none  DRAINS:   LOCAL MEDICATIONS USED:  LIDOCAINE   SPECIMEN:  Excision  DISPOSITION OF SPECIMEN:  N/A  COUNTS:  YES  TOURNIQUET:  * No tourniquets in log *  DICTATION: .Other Dictation: Dictation Number [id number]  PLAN OF CARE: Discharge to home after PACU  PATIENT DISPOSITION:  Short Stay   Delay start of Pharmacological VTE agent (>24hrs) due to surgical blood loss or risk of bleeding: not applicable

## 2012-08-31 NOTE — Brief Op Note (Signed)
08/30/2012  8:17 AM  PATIENT:  Amy Guzman  56 y.o. female  PRE-OPERATIVE DIAGNOSIS:  cyst lower lid right eye  POST-OPERATIVE DIAGNOSIS:  * No post-op diagnosis entered *  PROCEDURE:  Procedure(s) (LRB) with comments: MINOR EXICISION OF LESION (Left)  SURGEON:  Surgeon(s) and Role:    Vita Erm., MD - Primary  PHYSICIAN ASSISTANT:   ASSISTANTS: none   ANESTHESIA:   local  EBL:     BLOOD ADMINISTERED:none  DRAINS: none   LOCAL MEDICATIONS USED:  XYLOCAINE   SPECIMEN:  Excision  DISPOSITION OF SPECIMEN:  PATHOLOGY  COUNTS:  YES  TOURNIQUET:  * No tourniquets in log *  DICTATION: .Other Dictation: Dictation Number [id number]  PLAN OF CARE: Discharge to home after PACU  PATIENT DISPOSITION:  Short Stay   Delay start of Pharmacological VTE agent (>24hrs) due to surgical blood loss or risk of bleeding: not applicable

## 2012-09-02 NOTE — Op Note (Signed)
Amy Guzman, Amy Guzman             ACCOUNT NO.:  [id number]  MEDICAL RECORD NO.:  [id number]  LOCATION:                                 FACILITY:  PHYSICIAN:  Salley Scarlet., M.D. DATE OF BIRTH:  DATE OF PROCEDURE: DATE OF DISCHARGE:                              OPERATIVE REPORT   This is a 56 year old lady with multiple cysts of both eyes.  She has had multiple cyst excisions removed from the right eye.  She now has multiple cysts of the upper and lower lids of the left eye.  The lesions have seemed increase in numbness of eyes over the past several months. The patient requested that the lesion would be excised.  She was admitted at this time for excision of multiple cysts, upper and lower lid, left eye.  PROCEDURE:  On influence of local anesthesia with Xylocaine 1%, the patient was prepped and draped in usual manner.  Attention was applied to a small cyst, which was located along the medial canthal area adjacent to the nose.  A curvilinear excision was made around the lesion and the lesion was excised in toto using sharp and blunt dissection. The skin was approximated using interrupted sutures of 6-0 silk.  The attention was then given to the larger lesion, which was located along the lateral aspect of the upper lid of the left eye.  A curvilinear incision was made around the cyst and the lesion was undermined using sharp and blunt dissection.  The cyst was excised in toto using the sharp and blunt dissection.  The skin was reapproximated with interrupted sutures of 6-0 silk.  A smaller lesion adjacent to this larger lesion of the upper lid of the left eye was also excised in a similar fashion.  Attention was then diverted to a cluster of cysts, which were located along the lower lid near the lateral canthus.  This cluster was undermined by first outlining the lesions with curvilinear incision.  It was then excised in toto using the sharp and blunt dissection.  The skin  was reapproximated using interrupted sutures of 6- 0 nylon.  Polysporin ophthalmic ointment was applied to the surgical site.  A pressure patch was applied and the patient was discharged in satisfactory condition.  She was instructed to keep the patch on tightly today to remove it tomorrow morning and to resume her Polysporin ophthalmic ointment to the suture sites twice a day.  She was instructed to see me in 1 week for suture removal.     Salley Scarlet., M.D.     TB/MEDQ  D:  08/31/2012  T:  08/31/2012  Job:  [id number]

## 2012-09-20 ENCOUNTER — Other Ambulatory Visit: Payer: Self-pay | Admitting: Ophthalmology

## 2013-06-02 ENCOUNTER — Telehealth: Payer: Self-pay | Admitting: *Deleted

## 2013-06-02 NOTE — Telephone Encounter (Signed)
Left message for pt to return my call to see if she is wanting to get re-established w/ Dr. Darnelle Catalan.

## 2013-06-02 NOTE — Telephone Encounter (Signed)
Pt returned my call and I confirmed 07/17/13 appt w/ pt.  Mailed before appt letter & packet to pt.  Took paperwork to Med Rec for chart.

## 2013-07-10 ENCOUNTER — Other Ambulatory Visit: Payer: Self-pay | Admitting: *Deleted

## 2013-07-10 DIAGNOSIS — Z853 Personal history of malignant neoplasm of breast: Secondary | ICD-10-CM

## 2013-07-15 ENCOUNTER — Telehealth: Payer: Self-pay | Admitting: *Deleted

## 2013-07-15 NOTE — Telephone Encounter (Signed)
Pt called and left a message for Ezzard Standing stating that she is not able to make her appt on 9/25 due to school and needs to reschedule.  Called and left a message for pt to return my call.

## 2013-07-17 ENCOUNTER — Ambulatory Visit: Payer: BC Managed Care – PPO | Admitting: Oncology

## 2013-07-17 ENCOUNTER — Ambulatory Visit: Payer: BC Managed Care – PPO

## 2013-07-17 ENCOUNTER — Other Ambulatory Visit: Payer: BC Managed Care – PPO | Admitting: Lab

## 2013-08-14 ENCOUNTER — Telehealth: Payer: Self-pay | Admitting: *Deleted

## 2013-08-14 NOTE — Telephone Encounter (Signed)
Left message for pt to return my call if she is wants to come in and see Dr. Darnelle Catalan.

## 2015-07-07 ENCOUNTER — Ambulatory Visit: Payer: BC Managed Care – PPO | Admitting: Podiatry

## 2015-07-21 ENCOUNTER — Encounter: Payer: Self-pay | Admitting: Podiatry

## 2015-07-21 ENCOUNTER — Ambulatory Visit (INDEPENDENT_AMBULATORY_CARE_PROVIDER_SITE_OTHER): Payer: BC Managed Care – PPO | Admitting: Podiatry

## 2015-07-21 VITALS — BP 102/63 | HR 79 | Temp 99.1°F | Resp 14

## 2015-07-21 DIAGNOSIS — M79673 Pain in unspecified foot: Secondary | ICD-10-CM

## 2015-07-21 DIAGNOSIS — B351 Tinea unguium: Secondary | ICD-10-CM | POA: Diagnosis not present

## 2015-07-21 DIAGNOSIS — Q828 Other specified congenital malformations of skin: Secondary | ICD-10-CM

## 2015-07-21 NOTE — Progress Notes (Signed)
   Subjective:    Patient ID: Amy Guzman, female    DOB: 1956-04-24, 59 y.o.   MRN: [id number]  HPI This patient presents today complaining of bilateral foot pain right greater than left for the past 6 months gradually increasing in discomfort upon weight-bearing over time. Patient is complaining of pain primarily in the skin lesion on the plantar aspect of the right foot, however, has some discomfort in the MPJ of the left foot. 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 She was complaining of similar problems and she was last evaluated 08/26/2012 in our office. At that time patient was advised to wear metatarsal pads. 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 She's also had surgical treatment for left bunion deformity.  Review of Systems  Musculoskeletal: Positive for gait problem.       Objective:   Physical Exam  Orientated 3  Vascular: DP and PT pulses 2/4 bilaterally Capillary reflex immediate bilaterally  Neurological: Sensation to 10 g monofilament wire intact 5/5 bilaterally Vibratory sensation intact bilaterally Ankle reflexes reactive right, deferred left because of ankle fusion  Musculoskeletal: Right foot has a medial longitudinal arch elevation Pes planus left Left ankle fused neutral 90 Right ankle without any restriction of motion Residual HAV bunion left  Dermatological: Nucleated plantar keratoses third MPJ right which seems to be primary area of discomfort Diffuse plantar callus sub-second MPJ left Surgical scars medial lateral left ankle and dorsal left first MPJ The nails are hypertrophic, discolored, brittle, texture and color changes 6-10     Assessment & Plan:   Assessment: Satisfactory neurovascular status Painful nucleated plantar keratoses right Surgical fusion left ankle associated with altered gait with excessive weight transfer to right foot Mycotic  toenails 6-10  Plan: Advised patient the findings of the examination today The primary problem seems to be the nucleated keratoses in the plantar aspect the right foot which was debrided without any bleeding and packed with salinocaine. Debridement toenails 6-10 without any bleeding Wear athletic walking or running style shoes is much as possible  Return as needed for debridement

## 2015-07-21 NOTE — Patient Instructions (Addendum)
Today your examination revealed adequate circulation in your right and left feet with adequate feeling. There is a nucleated corn  like growth on the bottom of the right foot The nails are thick associated with fungal infection Return as needed

## 2018-08-28 ENCOUNTER — Ambulatory Visit
Admission: RE | Admit: 2018-08-28 | Discharge: 2018-08-28 | Disposition: A | Discharge status: Home / Self Care | Payer: BC Managed Care – PPO | Source: Ambulatory Visit | Attending: Family Medicine | Admitting: Family Medicine

## 2018-08-28 ENCOUNTER — Other Ambulatory Visit: Payer: Self-pay | Admitting: Family Medicine

## 2018-08-28 DIAGNOSIS — M130 Polyarthritis, unspecified: Secondary | ICD-10-CM

## 2019-07-15 ENCOUNTER — Other Ambulatory Visit: Payer: Self-pay

## 2019-07-15 DIAGNOSIS — Z20822 Contact with and (suspected) exposure to covid-19: Secondary | ICD-10-CM

## 2019-07-16 LAB — NOVEL CORONAVIRUS, NAA: SARS-CoV-2, NAA: NOT DETECTED

## 2019-11-10 ENCOUNTER — Other Ambulatory Visit: Payer: Self-pay | Admitting: Radiology

## 2020-07-13 DIAGNOSIS — Z03818 Encounter for observation for suspected exposure to other biological agents ruled out: Secondary | ICD-10-CM | POA: Diagnosis not present

## 2020-07-20 DIAGNOSIS — M109 Gout, unspecified: Secondary | ICD-10-CM | POA: Diagnosis not present

## 2020-07-20 DIAGNOSIS — M1000 Idiopathic gout, unspecified site: Secondary | ICD-10-CM | POA: Diagnosis not present

## 2020-07-20 DIAGNOSIS — E1169 Type 2 diabetes mellitus with other specified complication: Secondary | ICD-10-CM | POA: Diagnosis not present

## 2020-07-20 DIAGNOSIS — E785 Hyperlipidemia, unspecified: Secondary | ICD-10-CM | POA: Diagnosis not present

## 2020-07-20 DIAGNOSIS — I10 Essential (primary) hypertension: Secondary | ICD-10-CM | POA: Diagnosis not present

## 2020-07-20 DIAGNOSIS — E7800 Pure hypercholesterolemia, unspecified: Secondary | ICD-10-CM | POA: Diagnosis not present

## 2020-10-29 ENCOUNTER — Other Ambulatory Visit: Payer: Self-pay

## 2020-10-29 DIAGNOSIS — Z20822 Contact with and (suspected) exposure to covid-19: Secondary | ICD-10-CM | POA: Diagnosis not present

## 2020-11-02 LAB — NOVEL CORONAVIRUS, NAA: SARS-CoV-2, NAA: NOT DETECTED

## 2020-11-03 DIAGNOSIS — Z1231 Encounter for screening mammogram for malignant neoplasm of breast: Secondary | ICD-10-CM | POA: Diagnosis not present

## 2020-11-29 DIAGNOSIS — M130 Polyarthritis, unspecified: Secondary | ICD-10-CM | POA: Diagnosis not present

## 2020-11-29 DIAGNOSIS — M1000 Idiopathic gout, unspecified site: Secondary | ICD-10-CM | POA: Diagnosis not present

## 2020-11-29 DIAGNOSIS — E785 Hyperlipidemia, unspecified: Secondary | ICD-10-CM | POA: Diagnosis not present

## 2020-12-16 DIAGNOSIS — Z780 Asymptomatic menopausal state: Secondary | ICD-10-CM | POA: Diagnosis not present

## 2020-12-16 DIAGNOSIS — M8589 Other specified disorders of bone density and structure, multiple sites: Secondary | ICD-10-CM | POA: Diagnosis not present

## 2021-02-08 DIAGNOSIS — H02824 Cysts of left upper eyelid: Secondary | ICD-10-CM | POA: Diagnosis not present

## 2021-02-08 DIAGNOSIS — H43813 Vitreous degeneration, bilateral: Secondary | ICD-10-CM | POA: Diagnosis not present

## 2021-02-08 DIAGNOSIS — H02821 Cysts of right upper eyelid: Secondary | ICD-10-CM | POA: Diagnosis not present

## 2021-02-08 DIAGNOSIS — H25813 Combined forms of age-related cataract, bilateral: Secondary | ICD-10-CM | POA: Diagnosis not present

## 2021-02-14 DIAGNOSIS — Z0000 Encounter for general adult medical examination without abnormal findings: Secondary | ICD-10-CM | POA: Diagnosis not present

## 2021-03-14 ENCOUNTER — Other Ambulatory Visit: Payer: Self-pay | Admitting: Family Medicine

## 2021-03-14 ENCOUNTER — Other Ambulatory Visit: Payer: Self-pay

## 2021-03-14 ENCOUNTER — Ambulatory Visit
Admission: RE | Admit: 2021-03-14 | Discharge: 2021-03-14 | Disposition: A | Discharge status: Home / Self Care | Payer: Medicare PPO | Source: Ambulatory Visit | Attending: Family Medicine | Admitting: Family Medicine

## 2021-03-14 DIAGNOSIS — M25512 Pain in left shoulder: Secondary | ICD-10-CM

## 2021-03-22 DIAGNOSIS — M25512 Pain in left shoulder: Secondary | ICD-10-CM | POA: Diagnosis not present

## 2021-03-22 DIAGNOSIS — I10 Essential (primary) hypertension: Secondary | ICD-10-CM | POA: Diagnosis not present

## 2021-03-28 DIAGNOSIS — M25512 Pain in left shoulder: Secondary | ICD-10-CM | POA: Diagnosis not present

## 2021-03-28 DIAGNOSIS — M7502 Adhesive capsulitis of left shoulder: Secondary | ICD-10-CM | POA: Diagnosis not present

## 2021-03-28 DIAGNOSIS — M6281 Muscle weakness (generalized): Secondary | ICD-10-CM | POA: Diagnosis not present

## 2021-03-28 DIAGNOSIS — M25612 Stiffness of left shoulder, not elsewhere classified: Secondary | ICD-10-CM | POA: Diagnosis not present

## 2021-03-30 DIAGNOSIS — M7502 Adhesive capsulitis of left shoulder: Secondary | ICD-10-CM | POA: Diagnosis not present

## 2021-03-30 DIAGNOSIS — M6281 Muscle weakness (generalized): Secondary | ICD-10-CM | POA: Diagnosis not present

## 2021-03-30 DIAGNOSIS — M25512 Pain in left shoulder: Secondary | ICD-10-CM | POA: Diagnosis not present

## 2021-03-30 DIAGNOSIS — M25612 Stiffness of left shoulder, not elsewhere classified: Secondary | ICD-10-CM | POA: Diagnosis not present

## 2021-04-06 DIAGNOSIS — M25612 Stiffness of left shoulder, not elsewhere classified: Secondary | ICD-10-CM | POA: Diagnosis not present

## 2021-04-06 DIAGNOSIS — M6281 Muscle weakness (generalized): Secondary | ICD-10-CM | POA: Diagnosis not present

## 2021-04-06 DIAGNOSIS — M25512 Pain in left shoulder: Secondary | ICD-10-CM | POA: Diagnosis not present

## 2021-04-06 DIAGNOSIS — M7502 Adhesive capsulitis of left shoulder: Secondary | ICD-10-CM | POA: Diagnosis not present

## 2021-04-08 DIAGNOSIS — M25512 Pain in left shoulder: Secondary | ICD-10-CM | POA: Diagnosis not present

## 2021-04-08 DIAGNOSIS — M6281 Muscle weakness (generalized): Secondary | ICD-10-CM | POA: Diagnosis not present

## 2021-04-08 DIAGNOSIS — M7502 Adhesive capsulitis of left shoulder: Secondary | ICD-10-CM | POA: Diagnosis not present

## 2021-04-08 DIAGNOSIS — M25612 Stiffness of left shoulder, not elsewhere classified: Secondary | ICD-10-CM | POA: Diagnosis not present

## 2021-04-20 DIAGNOSIS — M25612 Stiffness of left shoulder, not elsewhere classified: Secondary | ICD-10-CM | POA: Diagnosis not present

## 2021-04-20 DIAGNOSIS — M6281 Muscle weakness (generalized): Secondary | ICD-10-CM | POA: Diagnosis not present

## 2021-04-20 DIAGNOSIS — M7502 Adhesive capsulitis of left shoulder: Secondary | ICD-10-CM | POA: Diagnosis not present

## 2021-04-20 DIAGNOSIS — M25512 Pain in left shoulder: Secondary | ICD-10-CM | POA: Diagnosis not present

## 2021-04-28 DIAGNOSIS — M7502 Adhesive capsulitis of left shoulder: Secondary | ICD-10-CM | POA: Diagnosis not present

## 2021-04-28 DIAGNOSIS — M25612 Stiffness of left shoulder, not elsewhere classified: Secondary | ICD-10-CM | POA: Diagnosis not present

## 2021-04-28 DIAGNOSIS — M6281 Muscle weakness (generalized): Secondary | ICD-10-CM | POA: Diagnosis not present

## 2021-04-28 DIAGNOSIS — M25512 Pain in left shoulder: Secondary | ICD-10-CM | POA: Diagnosis not present

## 2021-05-03 DIAGNOSIS — M25512 Pain in left shoulder: Secondary | ICD-10-CM | POA: Diagnosis not present

## 2021-05-03 DIAGNOSIS — M130 Polyarthritis, unspecified: Secondary | ICD-10-CM | POA: Diagnosis not present

## 2021-05-03 DIAGNOSIS — E785 Hyperlipidemia, unspecified: Secondary | ICD-10-CM | POA: Diagnosis not present

## 2021-05-03 DIAGNOSIS — Q828 Other specified congenital malformations of skin: Secondary | ICD-10-CM | POA: Diagnosis not present

## 2021-05-03 DIAGNOSIS — I10 Essential (primary) hypertension: Secondary | ICD-10-CM | POA: Diagnosis not present

## 2021-05-26 DIAGNOSIS — D23122 Other benign neoplasm of skin of left lower eyelid, including canthus: Secondary | ICD-10-CM | POA: Diagnosis not present

## 2021-05-26 DIAGNOSIS — D23121 Other benign neoplasm of skin of left upper eyelid, including canthus: Secondary | ICD-10-CM | POA: Diagnosis not present

## 2021-05-27 DIAGNOSIS — M25512 Pain in left shoulder: Secondary | ICD-10-CM | POA: Diagnosis not present

## 2021-06-01 DIAGNOSIS — D23122 Other benign neoplasm of skin of left lower eyelid, including canthus: Secondary | ICD-10-CM | POA: Diagnosis not present

## 2021-06-01 DIAGNOSIS — H2512 Age-related nuclear cataract, left eye: Secondary | ICD-10-CM | POA: Diagnosis not present

## 2021-06-07 DIAGNOSIS — M25512 Pain in left shoulder: Secondary | ICD-10-CM | POA: Diagnosis not present

## 2021-06-10 DIAGNOSIS — S46021D Laceration of muscle(s) and tendon(s) of the rotator cuff of right shoulder, subsequent encounter: Secondary | ICD-10-CM | POA: Diagnosis not present

## 2021-06-10 DIAGNOSIS — M25612 Stiffness of left shoulder, not elsewhere classified: Secondary | ICD-10-CM | POA: Diagnosis not present

## 2021-06-10 DIAGNOSIS — M25512 Pain in left shoulder: Secondary | ICD-10-CM | POA: Diagnosis not present

## 2021-06-10 DIAGNOSIS — M6281 Muscle weakness (generalized): Secondary | ICD-10-CM | POA: Diagnosis not present

## 2021-06-15 DIAGNOSIS — M6281 Muscle weakness (generalized): Secondary | ICD-10-CM | POA: Diagnosis not present

## 2021-06-15 DIAGNOSIS — M25512 Pain in left shoulder: Secondary | ICD-10-CM | POA: Diagnosis not present

## 2021-06-15 DIAGNOSIS — M25612 Stiffness of left shoulder, not elsewhere classified: Secondary | ICD-10-CM | POA: Diagnosis not present

## 2021-06-15 DIAGNOSIS — S46012D Strain of muscle(s) and tendon(s) of the rotator cuff of left shoulder, subsequent encounter: Secondary | ICD-10-CM | POA: Diagnosis not present

## 2021-06-22 DIAGNOSIS — S46012D Strain of muscle(s) and tendon(s) of the rotator cuff of left shoulder, subsequent encounter: Secondary | ICD-10-CM | POA: Diagnosis not present

## 2021-06-22 DIAGNOSIS — M25512 Pain in left shoulder: Secondary | ICD-10-CM | POA: Diagnosis not present

## 2021-06-22 DIAGNOSIS — M25612 Stiffness of left shoulder, not elsewhere classified: Secondary | ICD-10-CM | POA: Diagnosis not present

## 2021-06-22 DIAGNOSIS — M6281 Muscle weakness (generalized): Secondary | ICD-10-CM | POA: Diagnosis not present

## 2021-06-24 DIAGNOSIS — M25512 Pain in left shoulder: Secondary | ICD-10-CM | POA: Diagnosis not present

## 2021-06-24 DIAGNOSIS — M6281 Muscle weakness (generalized): Secondary | ICD-10-CM | POA: Diagnosis not present

## 2021-06-24 DIAGNOSIS — M25612 Stiffness of left shoulder, not elsewhere classified: Secondary | ICD-10-CM | POA: Diagnosis not present

## 2021-06-24 DIAGNOSIS — S46012D Strain of muscle(s) and tendon(s) of the rotator cuff of left shoulder, subsequent encounter: Secondary | ICD-10-CM | POA: Diagnosis not present

## 2021-06-28 DIAGNOSIS — M6281 Muscle weakness (generalized): Secondary | ICD-10-CM | POA: Diagnosis not present

## 2021-06-28 DIAGNOSIS — M25612 Stiffness of left shoulder, not elsewhere classified: Secondary | ICD-10-CM | POA: Diagnosis not present

## 2021-06-28 DIAGNOSIS — M25512 Pain in left shoulder: Secondary | ICD-10-CM | POA: Diagnosis not present

## 2021-06-28 DIAGNOSIS — S46012D Strain of muscle(s) and tendon(s) of the rotator cuff of left shoulder, subsequent encounter: Secondary | ICD-10-CM | POA: Diagnosis not present

## 2021-07-07 DIAGNOSIS — S46012D Strain of muscle(s) and tendon(s) of the rotator cuff of left shoulder, subsequent encounter: Secondary | ICD-10-CM | POA: Diagnosis not present

## 2021-07-07 DIAGNOSIS — M25612 Stiffness of left shoulder, not elsewhere classified: Secondary | ICD-10-CM | POA: Diagnosis not present

## 2021-07-07 DIAGNOSIS — M25512 Pain in left shoulder: Secondary | ICD-10-CM | POA: Diagnosis not present

## 2021-07-07 DIAGNOSIS — M6281 Muscle weakness (generalized): Secondary | ICD-10-CM | POA: Diagnosis not present

## 2021-07-15 DIAGNOSIS — M25512 Pain in left shoulder: Secondary | ICD-10-CM | POA: Diagnosis not present

## 2021-07-15 DIAGNOSIS — S46012D Strain of muscle(s) and tendon(s) of the rotator cuff of left shoulder, subsequent encounter: Secondary | ICD-10-CM | POA: Diagnosis not present

## 2021-07-15 DIAGNOSIS — M6281 Muscle weakness (generalized): Secondary | ICD-10-CM | POA: Diagnosis not present

## 2021-07-15 DIAGNOSIS — M25612 Stiffness of left shoulder, not elsewhere classified: Secondary | ICD-10-CM | POA: Diagnosis not present

## 2021-07-19 DIAGNOSIS — M25512 Pain in left shoulder: Secondary | ICD-10-CM | POA: Diagnosis not present

## 2021-07-27 DIAGNOSIS — M25612 Stiffness of left shoulder, not elsewhere classified: Secondary | ICD-10-CM | POA: Diagnosis not present

## 2021-07-27 DIAGNOSIS — M6281 Muscle weakness (generalized): Secondary | ICD-10-CM | POA: Diagnosis not present

## 2021-07-27 DIAGNOSIS — S46012D Strain of muscle(s) and tendon(s) of the rotator cuff of left shoulder, subsequent encounter: Secondary | ICD-10-CM | POA: Diagnosis not present

## 2021-07-27 DIAGNOSIS — M25512 Pain in left shoulder: Secondary | ICD-10-CM | POA: Diagnosis not present

## 2021-08-03 DIAGNOSIS — M25612 Stiffness of left shoulder, not elsewhere classified: Secondary | ICD-10-CM | POA: Diagnosis not present

## 2021-08-03 DIAGNOSIS — M6281 Muscle weakness (generalized): Secondary | ICD-10-CM | POA: Diagnosis not present

## 2021-08-12 DIAGNOSIS — M25612 Stiffness of left shoulder, not elsewhere classified: Secondary | ICD-10-CM | POA: Diagnosis not present

## 2021-08-12 DIAGNOSIS — S46012D Strain of muscle(s) and tendon(s) of the rotator cuff of left shoulder, subsequent encounter: Secondary | ICD-10-CM | POA: Diagnosis not present

## 2021-08-12 DIAGNOSIS — M25512 Pain in left shoulder: Secondary | ICD-10-CM | POA: Diagnosis not present

## 2021-08-12 DIAGNOSIS — M6281 Muscle weakness (generalized): Secondary | ICD-10-CM | POA: Diagnosis not present

## 2021-08-17 DIAGNOSIS — M6281 Muscle weakness (generalized): Secondary | ICD-10-CM | POA: Diagnosis not present

## 2021-08-17 DIAGNOSIS — M25612 Stiffness of left shoulder, not elsewhere classified: Secondary | ICD-10-CM | POA: Diagnosis not present

## 2021-08-17 DIAGNOSIS — M25512 Pain in left shoulder: Secondary | ICD-10-CM | POA: Diagnosis not present

## 2021-08-17 DIAGNOSIS — S46012D Strain of muscle(s) and tendon(s) of the rotator cuff of left shoulder, subsequent encounter: Secondary | ICD-10-CM | POA: Diagnosis not present

## 2021-08-26 DIAGNOSIS — M25512 Pain in left shoulder: Secondary | ICD-10-CM | POA: Diagnosis not present

## 2021-08-31 DIAGNOSIS — M25612 Stiffness of left shoulder, not elsewhere classified: Secondary | ICD-10-CM | POA: Diagnosis not present

## 2021-08-31 DIAGNOSIS — S46012D Strain of muscle(s) and tendon(s) of the rotator cuff of left shoulder, subsequent encounter: Secondary | ICD-10-CM | POA: Diagnosis not present

## 2021-08-31 DIAGNOSIS — M25512 Pain in left shoulder: Secondary | ICD-10-CM | POA: Diagnosis not present

## 2021-08-31 DIAGNOSIS — M6281 Muscle weakness (generalized): Secondary | ICD-10-CM | POA: Diagnosis not present

## 2021-09-06 DIAGNOSIS — M6281 Muscle weakness (generalized): Secondary | ICD-10-CM | POA: Diagnosis not present

## 2021-09-06 DIAGNOSIS — S46012D Strain of muscle(s) and tendon(s) of the rotator cuff of left shoulder, subsequent encounter: Secondary | ICD-10-CM | POA: Diagnosis not present

## 2021-09-06 DIAGNOSIS — M25612 Stiffness of left shoulder, not elsewhere classified: Secondary | ICD-10-CM | POA: Diagnosis not present

## 2021-09-06 DIAGNOSIS — M25512 Pain in left shoulder: Secondary | ICD-10-CM | POA: Diagnosis not present

## 2021-09-13 DIAGNOSIS — I10 Essential (primary) hypertension: Secondary | ICD-10-CM | POA: Diagnosis not present

## 2021-09-13 DIAGNOSIS — F320 Major depressive disorder, single episode, mild: Secondary | ICD-10-CM | POA: Diagnosis not present

## 2021-09-13 DIAGNOSIS — N939 Abnormal uterine and vaginal bleeding, unspecified: Secondary | ICD-10-CM | POA: Diagnosis not present

## 2021-09-13 DIAGNOSIS — M1000 Idiopathic gout, unspecified site: Secondary | ICD-10-CM | POA: Diagnosis not present

## 2021-09-14 ENCOUNTER — Other Ambulatory Visit: Payer: Self-pay | Admitting: Family Medicine

## 2021-09-14 DIAGNOSIS — N939 Abnormal uterine and vaginal bleeding, unspecified: Secondary | ICD-10-CM

## 2021-09-16 IMAGING — CR DG SHOULDER 2+V*L*
3 series · 3 of 3 positions shown · non-contrast
Comparison: None.

CLINICAL DATA: Left shoulder pain.  Unspecified chronicity.

EXAM:
LEFT SHOULDER - 2+ VIEW

[w shoulder grashey left]
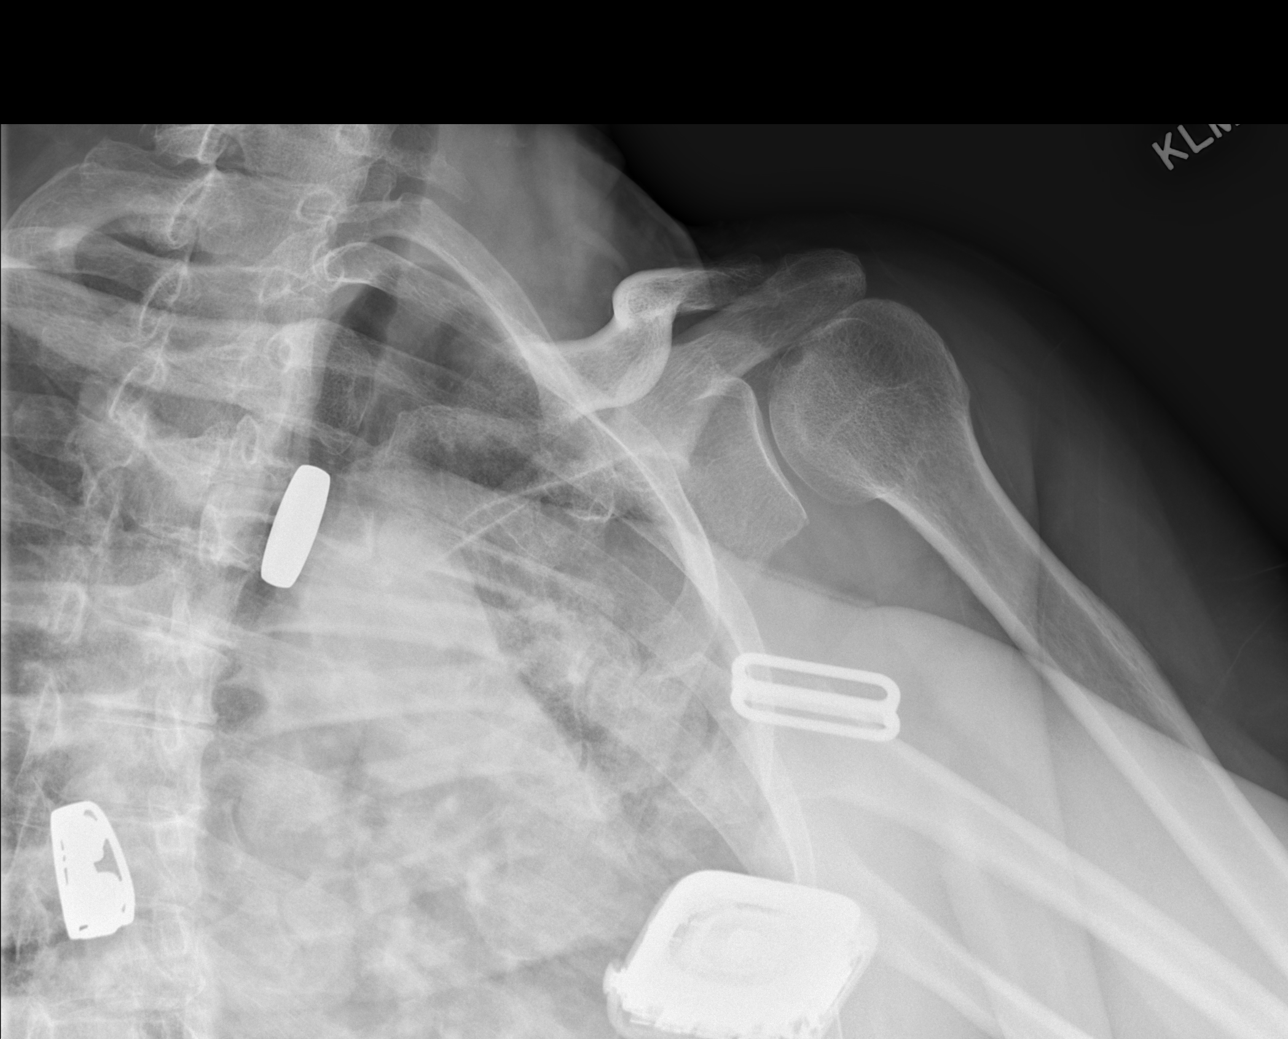

[w shoulder y-view left]
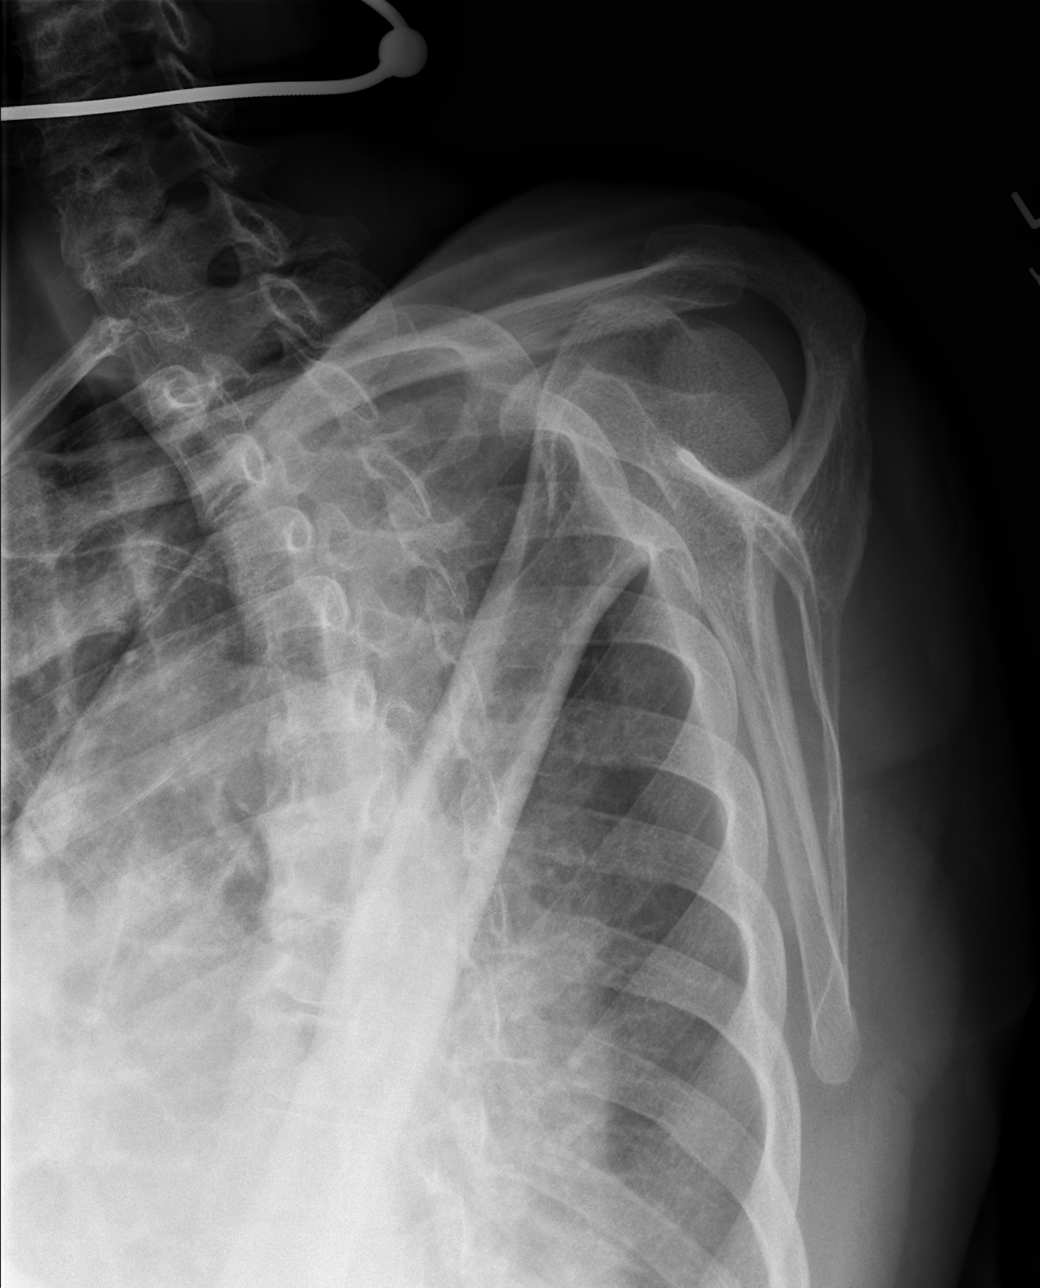

[x shoulder axillary left]
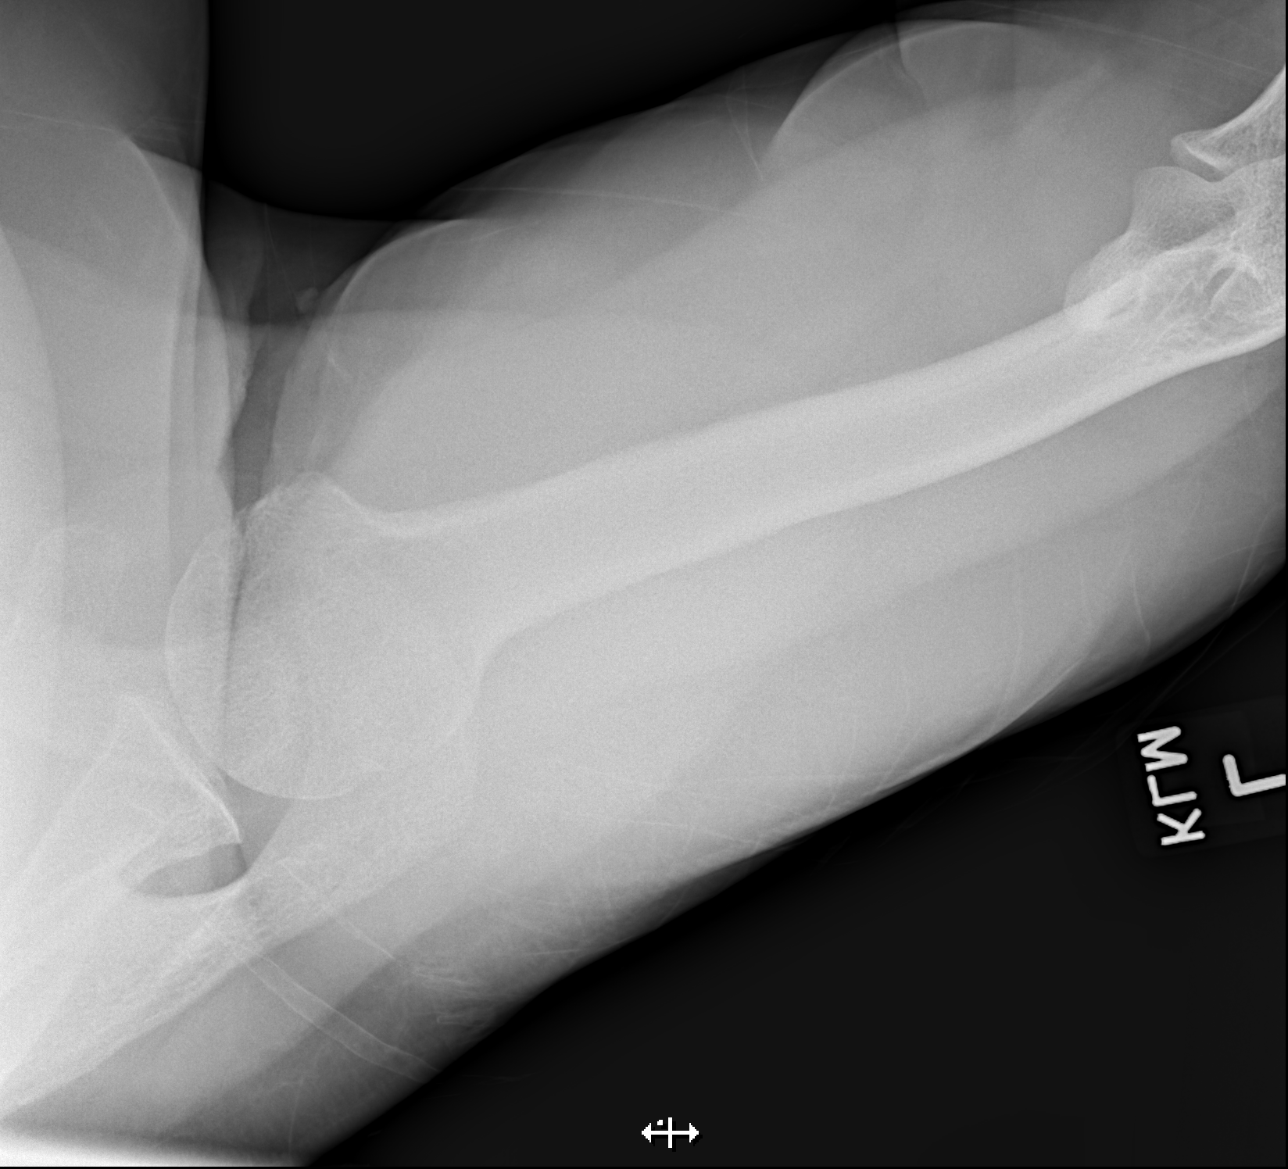

[3 of 3 positions shown; findings below may reference images not displayed]

FINDINGS: There is no evidence of fracture or dislocation. There is no
evidence of arthropathy or other focal bone abnormality. Soft
tissues are unremarkable.
IMPRESSION: No acute displaced fracture or dislocation.

## 2021-09-19 ENCOUNTER — Other Ambulatory Visit: Payer: Medicare PPO

## 2021-09-20 ENCOUNTER — Ambulatory Visit
Admission: RE | Admit: 2021-09-20 | Discharge: 2021-09-20 | Disposition: A | Discharge status: Home / Self Care | Payer: Medicare PPO | Source: Ambulatory Visit | Attending: Family Medicine | Admitting: Family Medicine

## 2021-09-20 DIAGNOSIS — R9389 Abnormal findings on diagnostic imaging of other specified body structures: Secondary | ICD-10-CM | POA: Diagnosis not present

## 2021-09-20 DIAGNOSIS — N939 Abnormal uterine and vaginal bleeding, unspecified: Secondary | ICD-10-CM

## 2021-09-20 DIAGNOSIS — N950 Postmenopausal bleeding: Secondary | ICD-10-CM | POA: Diagnosis not present

## 2021-09-20 DIAGNOSIS — N8500 Endometrial hyperplasia, unspecified: Secondary | ICD-10-CM | POA: Diagnosis not present

## 2021-09-29 ENCOUNTER — Other Ambulatory Visit (HOSPITAL_COMMUNITY)
Admission: RE | Admit: 2021-09-29 | Discharge: 2021-09-29 | Disposition: A | Discharge status: Home / Self Care | Payer: Medicare PPO | Source: Ambulatory Visit | Attending: Obstetrics and Gynecology | Admitting: Obstetrics and Gynecology

## 2021-09-29 ENCOUNTER — Ambulatory Visit: Payer: Medicare PPO | Admitting: Obstetrics and Gynecology

## 2021-09-29 ENCOUNTER — Encounter: Payer: Self-pay | Admitting: Obstetrics and Gynecology

## 2021-09-29 ENCOUNTER — Other Ambulatory Visit: Payer: Self-pay

## 2021-09-29 VITALS — BP 130/74 | HR 83 | Ht 62.0 in | Wt 205.0 lb

## 2021-09-29 DIAGNOSIS — R9389 Abnormal findings on diagnostic imaging of other specified body structures: Secondary | ICD-10-CM | POA: Diagnosis not present

## 2021-09-29 DIAGNOSIS — Z124 Encounter for screening for malignant neoplasm of cervix: Secondary | ICD-10-CM | POA: Insufficient documentation

## 2021-09-29 DIAGNOSIS — Z1151 Encounter for screening for human papillomavirus (HPV): Secondary | ICD-10-CM | POA: Diagnosis not present

## 2021-09-29 DIAGNOSIS — R87610 Atypical squamous cells of undetermined significance on cytologic smear of cervix (ASC-US): Secondary | ICD-10-CM | POA: Insufficient documentation

## 2021-09-29 DIAGNOSIS — N882 Stricture and stenosis of cervix uteri: Secondary | ICD-10-CM

## 2021-09-29 DIAGNOSIS — N950 Postmenopausal bleeding: Secondary | ICD-10-CM | POA: Diagnosis not present

## 2021-09-29 DIAGNOSIS — D261 Other benign neoplasm of corpus uteri: Secondary | ICD-10-CM | POA: Insufficient documentation

## 2021-09-29 DIAGNOSIS — Z01419 Encounter for gynecological examination (general) (routine) without abnormal findings: Secondary | ICD-10-CM | POA: Insufficient documentation

## 2021-09-29 DIAGNOSIS — R195 Other fecal abnormalities: Secondary | ICD-10-CM | POA: Insufficient documentation

## 2021-09-29 DIAGNOSIS — Z1211 Encounter for screening for malignant neoplasm of colon: Secondary | ICD-10-CM | POA: Insufficient documentation

## 2021-09-29 NOTE — Progress Notes (Signed)
65 y.o. [id number] Married Black or [place] American Not Hispanic or Latino female here for evaluation of postmenopausal bleeding. She had light spotting x 5-7 days at the end of November. No pain, no intercourse prior to the bleeding.   Ultrasound on 09/20/21, images reviewed.  IMPRESSION: Nonvisualization of ovaries. Thickened endometrial complex 5 mm thick, abnormal for a postmenopausal patient with bleeding; in the setting of post-menopausal bleeding, endometrial sampling is indicated to exclude carcinoma. If results are benign, sonohysterogram should be considered for focal lesion work-up. (Ref: Radiological Reasoning: Algorithmic Workup of Abnormal Vaginal Bleeding with Endovaginal Sonography and Sonohysterography. AJR 2008; [id number]:J00-93  No LMP recorded. Patient is postmenopausal.          Sexually active: Yes.    The current method of family planning is post menopausal status.    Exercising: No.  The patient does not participate in regular exercise at present. Smoker:  no  Health Maintenance: Pap:  years ago  History of abnormal Pap:  no MMG:  every 6 months at [facility]  BMD:   done at [facility]  Colonoscopy: 2020 polyps F/u 10 years  TDaP:  not up today  Gardasil: none    reports that she has never smoked. She does not have any smokeless tobacco history on file. She reports current alcohol use. She reports that she does not use drugs. 3 kids, local. 3 grand kids (9,8 and 5).  Past Medical History:  Diagnosis Date   Blood transfusion without reported diagnosis    breast cancer    Breast Cancer 1995 and 2005   Hyperlipidemia    Hypertension     Past Surgical History:  Procedure Laterality Date   BREAST SURGERY     CHALAZION EXCISION  08/09/2012   Procedure: MINOR EXCISION OF CHALAZION;  Surgeon: Myrtha Mantis., MD;  Location: [place];  Service: Ophthalmology;  Laterality: Right;  Cyst Excision of the lower Lid Right Eye    LESION REMOVAL  08/30/2012    Procedure: MINOR EXICISION OF LESION;  Surgeon: Myrtha Mantis., MD;  Location: [place];  Service: Ophthalmology;  Laterality: Left;    Current Outpatient Medications  Medication Sig Dispense Refill   ALLOPURINOL PO Take by mouth.     meloxicam (MOBIC) 7.5 MG tablet Take 7.5 mg by mouth 2 (two) times daily as needed.     rosuvastatin (CRESTOR) 20 MG tablet Take 20 mg by mouth daily.     valsartan-hydrochlorothiazide (DIOVAN-HCT) 160-12.5 MG per tablet Take 1 tablet by mouth daily.     No current facility-administered medications for this visit.    Family History  Problem Relation Age of Onset   Breast cancer Maternal Aunt     Review of Systems  Genitourinary:  Positive for vaginal bleeding.  All other systems reviewed and are negative.  Exam:   BP 130/74   Pulse 83   Ht 5\' 2"  (1.575 m)   Wt 205 lb (93 kg)   SpO2 99%   BMI 37.49 kg/m   Weight change: @WEIGHTCHANGE @ Height:   Height: 5\' 2"  (157.5 cm)  Ht Readings from Last 3 Encounters:  09/29/21 5\' 2"  (1.575 m)    General appearance: alert, cooperative and appears stated age   Pelvic: External genitalia:  no lesions              Urethra:  normal appearing urethra with no masses, tenderness or lesions  Bartholins and Skenes: normal                 Vagina: normal appearing vagina with normal color and discharge, no lesions              Cervix: no lesions and stenotic               Bimanual Exam:  Uterus:  normal size, contour, position, consistency, mobility, non-tender and anteverted              Adnexa: no mass, fullness, tenderness                The risks of endometrial biopsy were reviewed and a consent was obtained.  A speculum was placed in the vagina and the cervix was cleansed with Hibiclens. A tenaculum was placed on the cervix, the cervix was stenotic and needed to be dilated. The cervix was dilated with the mini-dilators up to a 3 hagar dilator and the uterine evacuator  curette was placed into the endometrial cavity. The uterus sounded to 6 cm. The endometrial biopsy was performed, taking care to get a representative sample, sampling 360 degrees of the uterine cavity. The cavity had the characteristically gritty texture throughout. Minimal tissue was obtained. The tenaculum and speculum were removed. There were no complications.    Gae Dry chaperoned for the exam.  1. Postmenopausal bleeding Ultrasound images reviewed, thickened stripe. -Endometrial biopsy done - Surgical pathology( [place]/ [facility]) -Depending on results we discussed need for either sonohysterogram or hysteroscopy, D&C  2. Thickened endometrium See above  3. Cervical stenosis (uterine cervix) Required cervical dilation  4. Screening for cervical cancer Overdue for pap - Cytology - PAP  CC: Dr Criss Rosales

## 2021-09-29 NOTE — Patient Instructions (Signed)

## 2021-09-30 LAB — SURGICAL PATHOLOGY

## 2021-10-03 ENCOUNTER — Other Ambulatory Visit: Payer: Self-pay

## 2021-10-03 DIAGNOSIS — N950 Postmenopausal bleeding: Secondary | ICD-10-CM

## 2021-10-03 DIAGNOSIS — R9389 Abnormal findings on diagnostic imaging of other specified body structures: Secondary | ICD-10-CM

## 2021-10-04 DIAGNOSIS — R9389 Abnormal findings on diagnostic imaging of other specified body structures: Secondary | ICD-10-CM | POA: Diagnosis not present

## 2021-10-04 DIAGNOSIS — I10 Essential (primary) hypertension: Secondary | ICD-10-CM | POA: Diagnosis not present

## 2021-10-04 DIAGNOSIS — N882 Stricture and stenosis of cervix uteri: Secondary | ICD-10-CM | POA: Diagnosis not present

## 2021-10-04 DIAGNOSIS — M130 Polyarthritis, unspecified: Secondary | ICD-10-CM | POA: Diagnosis not present

## 2021-10-04 DIAGNOSIS — E782 Mixed hyperlipidemia: Secondary | ICD-10-CM | POA: Diagnosis not present

## 2021-10-04 DIAGNOSIS — N950 Postmenopausal bleeding: Secondary | ICD-10-CM | POA: Diagnosis not present

## 2021-10-04 DIAGNOSIS — F419 Anxiety disorder, unspecified: Secondary | ICD-10-CM | POA: Diagnosis not present

## 2021-10-05 LAB — CYTOLOGY - PAP
Comment: NEGATIVE
Diagnosis: UNDETERMINED — AB
High risk HPV: NEGATIVE

## 2021-10-18 DIAGNOSIS — M25512 Pain in left shoulder: Secondary | ICD-10-CM | POA: Diagnosis not present

## 2021-11-09 DIAGNOSIS — Z1231 Encounter for screening mammogram for malignant neoplasm of breast: Secondary | ICD-10-CM | POA: Diagnosis not present

## 2021-11-17 ENCOUNTER — Other Ambulatory Visit: Payer: Self-pay | Admitting: Obstetrics and Gynecology

## 2021-11-17 ENCOUNTER — Ambulatory Visit (INDEPENDENT_AMBULATORY_CARE_PROVIDER_SITE_OTHER): Payer: Medicare PPO

## 2021-11-17 ENCOUNTER — Other Ambulatory Visit: Payer: Self-pay

## 2021-11-17 ENCOUNTER — Ambulatory Visit (INDEPENDENT_AMBULATORY_CARE_PROVIDER_SITE_OTHER): Payer: Medicare PPO | Admitting: Obstetrics and Gynecology

## 2021-11-17 ENCOUNTER — Encounter: Payer: Self-pay | Admitting: Obstetrics and Gynecology

## 2021-11-17 VITALS — BP 112/72 | Ht 62.0 in | Wt 205.0 lb

## 2021-11-17 DIAGNOSIS — N950 Postmenopausal bleeding: Secondary | ICD-10-CM

## 2021-11-17 DIAGNOSIS — R9389 Abnormal findings on diagnostic imaging of other specified body structures: Secondary | ICD-10-CM

## 2021-11-17 NOTE — Progress Notes (Signed)
GYNECOLOGY  VISIT   HPI: 66 y.o.   Married Black or [place] American Not Hispanic or Latino  female   [phone] with No LMP recorded. Patient is postmenopausal.   here for evaluation of PMP bleeding. She had light spotting x 5-7 days at the end of November.  Endometrial stripe was thickened on u/s in 11/22 to 5 mm. Endometrial biopsy in 12/22 returned with inactive endometrium. Pap from 12/22 returned as ASCUS, negative HPV  GYNECOLOGIC HISTORY: No LMP recorded. Patient is postmenopausal. Contraception:PMP Menopausal hormone therapy: No        OB History     Gravida  4   Para  3   Term  3   Preterm      AB  1   Living  3      SAB      IAB      Ectopic  1   Multiple      Live Births  3              Patient Active Problem List   Diagnosis Date Noted   Abnormal feces 09/29/2021   Colon cancer screening 09/29/2021    Past Medical History:  Diagnosis Date   Blood transfusion without reported diagnosis    breast cancer    Breast Cancer 1995 and 2005   Hyperlipidemia    Hypertension     Past Surgical History:  Procedure Laterality Date   BREAST SURGERY     CHALAZION EXCISION  08/09/2012   Procedure: MINOR EXCISION OF CHALAZION;  Surgeon: Myrtha Mantis., MD;  Location: [facility];  Service: Ophthalmology;  Laterality: Right;  Cyst Excision of the lower Lid Right Eye    LESION REMOVAL  08/30/2012   Procedure: MINOR EXICISION OF LESION;  Surgeon: Myrtha Mantis., MD;  Location: [facility];  Service: Ophthalmology;  Laterality: Left;    Current Outpatient Medications  Medication Sig Dispense Refill   ALLOPURINOL PO Take by mouth.     meloxicam (MOBIC) 7.5 MG tablet Take 7.5 mg by mouth 2 (two) times daily as needed.     rosuvastatin (CRESTOR) 20 MG tablet Take 20 mg by mouth daily.     valsartan-hydrochlorothiazide (DIOVAN-HCT) 160-12.5 MG per tablet Take 1 tablet by mouth daily.     No current  facility-administered medications for this visit.     ALLERGIES: Patient has no known allergies.  Family History  Problem Relation Age of Onset   Breast cancer Maternal Aunt     Social History   Socioeconomic History   Marital status: Married    Spouse name: Not on file   Number of children: Not on file   Years of education: Not on file   Highest education level: Not on file  Occupational History   Not on file  Tobacco Use   Smoking status: Never   Smokeless tobacco: Not on file  Substance and Sexual Activity   Alcohol use: Yes    Alcohol/week: 0.0 standard drinks    Comment: occas.   Drug use: No   Sexual activity: Yes    Birth control/protection: Post-menopausal  Other Topics Concern   Not on file  Social History Narrative   Not on file   Social Determinants of Health   Financial Resource Strain: Not on file  Food Insecurity: Not on file  Transportation Needs: Not on file  Physical Activity: Not on file  Stress: Not on file  Social Connections: Not  on file  Intimate Partner Violence: Not on file    ROS  PHYSICAL EXAMINATION:    There were no vitals taken for this visit.    General appearance: alert, cooperative and appears stated age  Limited pelvic ultrasound  Indications: PMP spotting, h/o thickened endometrium  Findings:   Anteverted uterus, heterogeneous myometrium, no myometrial masses  Endometrium 3.43 mm, uniform, avascular  Ovaries not evaluated  Impression:  Uniform, thin endometrium  1. Post-menopausal bleeding Biopsy with inactive endometrium No further bleeding  2. Thickened endometrium Thin and symmetrical No further evaluation needed Call with any further bleeding

## 2021-11-25 ENCOUNTER — Other Ambulatory Visit: Payer: Self-pay

## 2021-11-25 ENCOUNTER — Encounter (HOSPITAL_COMMUNITY): Payer: Self-pay

## 2021-11-25 ENCOUNTER — Ambulatory Visit (HOSPITAL_COMMUNITY)
Admission: EM | Admit: 2021-11-25 | Discharge: 2021-11-25 | Disposition: A | Discharge status: Home / Self Care | Payer: Medicare PPO | Attending: Family Medicine | Admitting: Family Medicine

## 2021-11-25 ENCOUNTER — Ambulatory Visit (INDEPENDENT_AMBULATORY_CARE_PROVIDER_SITE_OTHER): Payer: Medicare PPO

## 2021-11-25 DIAGNOSIS — M25561 Pain in right knee: Secondary | ICD-10-CM | POA: Diagnosis not present

## 2021-11-25 DIAGNOSIS — W19XXXA Unspecified fall, initial encounter: Secondary | ICD-10-CM

## 2021-11-25 DIAGNOSIS — M25461 Effusion, right knee: Secondary | ICD-10-CM | POA: Diagnosis not present

## 2021-11-25 DIAGNOSIS — S8991XA Unspecified injury of right lower leg, initial encounter: Secondary | ICD-10-CM | POA: Diagnosis not present

## 2021-11-25 MED ORDER — HYDROCODONE-ACETAMINOPHEN 5-325 MG PO TABS: 1.0000 | ORAL_TABLET | Freq: Four times a day (QID) | ORAL | 0 refills | Status: DC | PRN

## 2021-11-25 NOTE — ED Provider Notes (Signed)
[facility]    CSN: [id number] Arrival date & time: 11/25/21  1056      History   Chief Complaint Chief Complaint  Patient presents with   Fall   Knee Injury    HPI Amy Guzman is a 66 y.o. female.   Patient is here for right knee injury.  She was walking her dog down a wooden wheel chair ramp, he saw a squirrel and pulled her.  She landed on her butt.  Her left leg was straight, but her right knee was bent outward.  She had immediate pain (she has arthritis already) but was much worse.  She thinks it is swollen.  She is not able to bear much weight at all;   she is having medial knee pain.   Past Medical History:  Diagnosis Date   Blood transfusion without reported diagnosis    breast cancer    Breast Cancer 1995 and 2005   Hyperlipidemia    Hypertension     Patient Active Problem List   Diagnosis Date Noted   Abnormal feces 09/29/2021   Colon cancer screening 09/29/2021    Past Surgical History:  Procedure Laterality Date   BREAST SURGERY     CHALAZION EXCISION  08/09/2012   Procedure: MINOR EXCISION OF CHALAZION;  Surgeon: Myrtha Mantis., MD;  Location: [facility];  Service: Ophthalmology;  Laterality: Right;  Cyst Excision of the lower Lid Right Eye    LESION REMOVAL  08/30/2012   Procedure: MINOR EXICISION OF LESION;  Surgeon: Myrtha Mantis., MD;  Location: [facility];  Service: Ophthalmology;  Laterality: Left;    OB History     Gravida  4   Para  3   Term  3   Preterm      AB  1   Living  3      SAB      IAB      Ectopic  1   Multiple      Live Births  3            Home Medications    Prior to Admission medications   Medication Sig Start Date End Date Taking? Authorizing Provider  ALLOPURINOL PO Take by mouth.    [provider]  meloxicam (MOBIC) 7.5 MG tablet Take 7.5 mg by mouth 2 (two) times daily as needed. 08/12/21   [provider]   rosuvastatin (CRESTOR) 20 MG tablet Take 20 mg by mouth daily.    [provider]  valsartan-hydrochlorothiazide (DIOVAN-HCT) 160-12.5 MG per tablet Take 1 tablet by mouth daily.    [provider]    Family History Family History  Problem Relation Age of Onset   Breast cancer Maternal Aunt     Social History Social History   Tobacco Use   Smoking status: Never  Substance Use Topics   Alcohol use: Yes    Alcohol/week: 0.0 standard drinks    Comment: occas.   Drug use: No     Allergies   Patient has no known allergies.   Review of Systems Review of Systems  Musculoskeletal:  Positive for gait problem and joint swelling.    Physical Exam Triage Vital Signs ED Triage Vitals  Enc Vitals Group     BP 11/25/21 1155 135/73     Pulse Rate 11/25/21 1155 95     Resp 11/25/21 1155 17     Temp 11/25/21 1155 98.8 F (37.1 C)  Temp Source 11/25/21 1155 Oral     SpO2 11/25/21 1155 98 %     Weight --      Height --      Head Circumference --      Peak Flow --      Pain Score 11/25/21 1157 10     Pain Loc --      Pain Edu? --      Excl. in [place]? --    No data found.  Updated Vital Signs BP 135/73 (BP Location: Right Arm)    Pulse 95    Temp 98.8 F (37.1 C) (Oral)    Resp 17    SpO2 98%   Visual Acuity Right Eye Distance:   Left Eye Distance:   Bilateral Distance:    Right Eye Near:   Left Eye Near:    Bilateral Near:     Physical Exam Constitutional:      Appearance: Normal appearance.     Comments: In a wheelchair  Musculoskeletal:     Comments: Swelling to the right medial knee;  TTP at the right medial knee;  decreased rom to flexion and extension due to pain;  pain with rotation of the lower leg externally;   Neurological:     Mental Status: She is alert.     UC Treatments / Results  Labs (all labs ordered are listed, but only abnormal results are displayed) Labs Reviewed - No data to display  EKG   Radiology DG Knee  Complete 4 Views Right  Result Date: 11/25/2021 CLINICAL DATA:  Fall EXAM: RIGHT KNEE - COMPLETE 4 VIEW COMPARISON:  None. FINDINGS: No acute fracture or dislocation. Small joint effusion. Moderate tricompartmental degenerative changes. Soft tissues are unremarkable. IMPRESSION: 1. No acute osseous abnormality. 2. Small joint effusion. Electronically Signed   By: Yetta Glassman M.D.   On: 11/25/2021 12:17    Procedures Procedures (including critical care time)  Medications Ordered in UC Medications - No data to display  Initial Impression / Assessment and Plan / UC Course  I have reviewed the triage vital signs and the nursing notes.  Pertinent labs & imaging results that were available during my care of the patient were reviewed by me and considered in my medical decision making (see chart for details).   Patient was seen today for knee pain after a fall;  xray was negative for fracture.  Discussed that with this injury she could have a tear.  Given a knee brace today and crutches;  pain medication sent to pharmacy.  Recommend ice an motrin.  Follow up with pcp for further testing.   Final Clinical Impressions(s) / UC Diagnoses   Final diagnoses:  Injury of right knee, initial encounter  Acute pain of right knee     Discharge Instructions      You were seen today for a right knee injury.  Your xray did not show any fracture.  It did show moderate arthritis.  I am concerned for a possible tear given the mechanism of injury.  I have sent out pain medication for you today.  You may take motrin for pain and swelling as well.  I recommend ice.   You were given a knee brace and crutches today.  Please follow up with your primary care provider for further testing as you may need an MRI.     ED Prescriptions     Medication Sig Dispense Auth. Provider   HYDROcodone-acetaminophen (NORCO/VICODIN) 5-325 MG tablet Take 1-2  tablets by mouth every 6 (six) hours as needed. 10 tablet Rondel Oh, MD      PDMP not reviewed this encounter.   Rondel Oh, MD 11/25/21 1236

## 2021-11-25 NOTE — ED Triage Notes (Signed)
Pt presents with right knee injury after a fall on a wheelchair ramp outside yesterday.

## 2021-11-25 NOTE — Discharge Instructions (Signed)
You were seen today for a right knee injury.  Your xray did not show any fracture.  It did show moderate arthritis.  I am concerned for a possible tear given the mechanism of injury.  I have sent out pain medication for you today.  You may take motrin for pain and swelling as well.  I recommend ice.   You were given a knee brace and crutches today.  Please follow up with your primary care provider for further testing as you may need an MRI.

## 2021-12-02 DIAGNOSIS — I10 Essential (primary) hypertension: Secondary | ICD-10-CM | POA: Diagnosis not present

## 2021-12-02 DIAGNOSIS — S80911A Unspecified superficial injury of right knee, initial encounter: Secondary | ICD-10-CM | POA: Diagnosis not present

## 2021-12-02 DIAGNOSIS — M25561 Pain in right knee: Secondary | ICD-10-CM | POA: Diagnosis not present

## 2021-12-02 DIAGNOSIS — Z6837 Body mass index (BMI) 37.0-37.9, adult: Secondary | ICD-10-CM | POA: Diagnosis not present

## 2021-12-02 DIAGNOSIS — E1169 Type 2 diabetes mellitus with other specified complication: Secondary | ICD-10-CM | POA: Diagnosis not present

## 2021-12-08 DIAGNOSIS — M25561 Pain in right knee: Secondary | ICD-10-CM | POA: Diagnosis not present

## 2022-02-21 DIAGNOSIS — M25561 Pain in right knee: Secondary | ICD-10-CM | POA: Diagnosis not present

## 2022-03-03 DIAGNOSIS — Z0000 Encounter for general adult medical examination without abnormal findings: Secondary | ICD-10-CM | POA: Diagnosis not present

## 2022-03-03 DIAGNOSIS — E1169 Type 2 diabetes mellitus with other specified complication: Secondary | ICD-10-CM | POA: Diagnosis not present

## 2022-03-03 DIAGNOSIS — N390 Urinary tract infection, site not specified: Secondary | ICD-10-CM | POA: Diagnosis not present

## 2022-03-03 DIAGNOSIS — I10 Essential (primary) hypertension: Secondary | ICD-10-CM | POA: Diagnosis not present

## 2022-03-03 DIAGNOSIS — M130 Polyarthritis, unspecified: Secondary | ICD-10-CM | POA: Diagnosis not present

## 2022-03-23 NOTE — Progress Notes (Signed)
Sent message, via epic in basket, requesting orders in epic from surgeon.  

## 2022-03-27 DIAGNOSIS — M25561 Pain in right knee: Secondary | ICD-10-CM | POA: Diagnosis not present

## 2022-03-28 ENCOUNTER — Ambulatory Visit: Payer: Self-pay | Admitting: Physician Assistant

## 2022-03-28 DIAGNOSIS — G8929 Other chronic pain: Secondary | ICD-10-CM

## 2022-03-28 NOTE — H&P (View-Only) (Signed)
TOTAL KNEE ADMISSION H&P  Patient is being admitted for right total knee arthroplasty.  Subjective:  Chief Complaint:right knee pain.  HPI: Amy Guzman, 66 y.o. female, has a history of pain and functional disability in the right knee due to arthritis and has failed non-surgical conservative treatments for greater than 12 weeks to includeNSAID's and/or analgesics, corticosteriod injections, and activity modification.  Onset of symptoms was gradual, starting 4 years ago with gradually worsening course since that time. The patient noted no past surgery on the right knee(s).  Patient currently rates pain in the right knee(s) at 7 out of 10 with activity. Patient has night pain, worsening of pain with activity and weight bearing, pain that interferes with activities of daily living, pain with passive range of motion, crepitus, and joint swelling.  Patient has evidence of periarticular osteophytes and joint space narrowing by imaging studies.  There is no active infection.  Patient Active Problem List   Diagnosis Date Noted   Abnormal feces 09/29/2021   Colon cancer screening 09/29/2021   Past Medical History:  Diagnosis Date   Blood transfusion without reported diagnosis    breast cancer    Breast Cancer 1995 and 2005   Hyperlipidemia    Hypertension     Past Surgical History:  Procedure Laterality Date   BREAST SURGERY     CHALAZION EXCISION  08/09/2012   Procedure: MINOR EXCISION OF CHALAZION;  Surgeon: Myrtha Mantis., MD;  Location: [facility];  Service: Ophthalmology;  Laterality: Right;  Cyst Excision of the lower Lid Right Eye    LESION REMOVAL  08/30/2012   Procedure: MINOR EXICISION OF LESION;  Surgeon: Myrtha Mantis., MD;  Location: [facility];  Service: Ophthalmology;  Laterality: Left;    Current Outpatient Medications  Medication Sig Dispense Refill Last Dose   allopurinol (ZYLOPRIM) 100 MG tablet Take 100 mg by mouth  daily.      etodolac (LODINE) 400 MG tablet Take 400 mg by mouth 2 (two) times daily.      HYDROcodone-acetaminophen (NORCO/VICODIN) 5-325 MG tablet Take 1-2 tablets by mouth every 6 (six) hours as needed. (Patient not taking: Reported on 03/24/2022) 10 tablet 0    MITIGARE 0.6 MG CAPS Take 0.6 mg by mouth daily.      rosuvastatin (CRESTOR) 10 MG tablet Take 10 mg by mouth daily.      valsartan-hydrochlorothiazide (DIOVAN-HCT) 80-12.5 MG tablet Take 1 tablet by mouth daily.      No current facility-administered medications for this visit.   No Known Allergies  Social History   Tobacco Use   Smoking status: Never   Smokeless tobacco: Not on file  Substance Use Topics   Alcohol use: Yes    Alcohol/week: 0.0 standard drinks    Comment: occas.    Family History  Problem Relation Age of Onset   Breast cancer Maternal Aunt      Review of Systems  Musculoskeletal:  Positive for arthralgias.  Skin:  Positive for rash.  All other systems reviewed and are negative.  Objective:  Physical Exam Constitutional:      General: She is not in acute distress.    Appearance: Normal appearance.  HENT:     Head: Normocephalic and atraumatic.  Eyes:     Extraocular Movements: Extraocular movements intact.     Pupils: Pupils are equal, round, and reactive to light.  Cardiovascular:     Rate and Rhythm: Normal rate and regular rhythm.  Pulses: Normal pulses.     Heart sounds: Normal heart sounds.  Pulmonary:     Effort: Pulmonary effort is normal. No respiratory distress.     Breath sounds: Normal breath sounds. No wheezing.  Abdominal:     General: Abdomen is flat. Bowel sounds are normal. There is no distension.     Palpations: Abdomen is soft.     Tenderness: There is no abdominal tenderness.  Musculoskeletal:     Cervical back: Normal range of motion and neck supple.     Right knee: Swelling and bony tenderness present. Tenderness present. LCL laxity and MCL laxity present.   Lymphadenopathy:     Cervical: No cervical adenopathy.  Skin:    General: Skin is warm and dry.     Findings: No erythema.  Neurological:     General: No focal deficit present.     Mental Status: She is alert and oriented to person, place, and time.  Psychiatric:        Mood and Affect: Mood normal.        Behavior: Behavior normal.    Vital signs in last 24 hours: '@VSRANGES'$ @  Labs:   Estimated body mass index is 37.49 kg/m as calculated from the following:   Height as of 11/17/21: '5\' 2"'$  (1.575 m).   Weight as of 11/17/21: 93 kg.   Imaging Review Plain radiographs demonstrate severe degenerative joint disease of the right knee(s). The overall alignment issignificant valgus. The bone quality appears to be good for age and reported activity level.      Assessment/Plan:  End stage arthritis, right knee   The patient history, physical examination, clinical judgment of the provider and imaging studies are consistent with end stage degenerative joint disease of the right knee(s) and total knee arthroplasty is deemed medically necessary. The treatment options including medical management, injection therapy arthroscopy and arthroplasty were discussed at length. The risks and benefits of total knee arthroplasty were presented and reviewed. The risks due to aseptic loosening, infection, stiffness, patella tracking problems, thromboembolic complications and other imponderables were discussed. The patient acknowledged the explanation, agreed to proceed with the plan and consent was signed. Patient is being admitted for inpatient treatment for surgery, pain control, PT, OT, prophylactic antibiotics, VTE prophylaxis, progressive ambulation and ADL's and discharge planning. The patient is planning to be discharged home with home health services    Anticipated LOS equal to or greater than 2 midnights due to - Age 53 and older with one or more of the following:  - Obesity  - Expected need  for hospital services (PT, OT, Nursing) required for safe  discharge  - Anticipated need for postoperative skilled nursing care or inpatient rehab  - Active co-morbidities: None OR   - Unanticipated findings during/Post Surgery: None  - Patient is a high risk of re-admission due to: None

## 2022-03-28 NOTE — H&P (Cosign Needed Addendum)
TOTAL KNEE ADMISSION H&P  Patient is being admitted for right total knee arthroplasty.  Subjective:  Chief Complaint:right knee pain.  HPI: Amy Guzman, 66 y.o. female, has a history of pain and functional disability in the right knee due to arthritis and has failed non-surgical conservative treatments for greater than 12 weeks to includeNSAID's and/or analgesics, corticosteriod injections, and activity modification.  Onset of symptoms was gradual, starting 4 years ago with gradually worsening course since that time. The patient noted no past surgery on the right knee(s).  Patient currently rates pain in the right knee(s) at 7 out of 10 with activity. Patient has night pain, worsening of pain with activity and weight bearing, pain that interferes with activities of daily living, pain with passive range of motion, crepitus, and joint swelling.  Patient has evidence of periarticular osteophytes and joint space narrowing by imaging studies.  There is no active infection.  Patient Active Problem List   Diagnosis Date Noted   Abnormal feces 09/29/2021   Colon cancer screening 09/29/2021   Past Medical History:  Diagnosis Date   Blood transfusion without reported diagnosis    breast cancer    Breast Cancer 1995 and 2005   Hyperlipidemia    Hypertension     Past Surgical History:  Procedure Laterality Date   BREAST SURGERY     CHALAZION EXCISION  08/09/2012   Procedure: MINOR EXCISION OF CHALAZION;  Surgeon: Myrtha Mantis., MD;  Location: [facility];  Service: Ophthalmology;  Laterality: Right;  Cyst Excision of the lower Lid Right Eye    LESION REMOVAL  08/30/2012   Procedure: MINOR EXICISION OF LESION;  Surgeon: Myrtha Mantis., MD;  Location: [facility];  Service: Ophthalmology;  Laterality: Left;    Current Outpatient Medications  Medication Sig Dispense Refill Last Dose   allopurinol (ZYLOPRIM) 100 MG tablet Take 100 mg by mouth  daily.      etodolac (LODINE) 400 MG tablet Take 400 mg by mouth 2 (two) times daily.      HYDROcodone-acetaminophen (NORCO/VICODIN) 5-325 MG tablet Take 1-2 tablets by mouth every 6 (six) hours as needed. (Patient not taking: Reported on 03/24/2022) 10 tablet 0    MITIGARE 0.6 MG CAPS Take 0.6 mg by mouth daily.      rosuvastatin (CRESTOR) 10 MG tablet Take 10 mg by mouth daily.      valsartan-hydrochlorothiazide (DIOVAN-HCT) 80-12.5 MG tablet Take 1 tablet by mouth daily.      No current facility-administered medications for this visit.   No Known Allergies  Social History   Tobacco Use   Smoking status: Never   Smokeless tobacco: Not on file  Substance Use Topics   Alcohol use: Yes    Alcohol/week: 0.0 standard drinks    Comment: occas.    Family History  Problem Relation Age of Onset   Breast cancer Maternal Aunt      Review of Systems  Musculoskeletal:  Positive for arthralgias.  Skin:  Positive for rash.  All other systems reviewed and are negative.  Objective:  Physical Exam Constitutional:      General: She is not in acute distress.    Appearance: Normal appearance.  HENT:     Head: Normocephalic and atraumatic.  Eyes:     Extraocular Movements: Extraocular movements intact.     Pupils: Pupils are equal, round, and reactive to light.  Cardiovascular:     Rate and Rhythm: Normal rate and regular rhythm.  Pulses: Normal pulses.     Heart sounds: Normal heart sounds.  Pulmonary:     Effort: Pulmonary effort is normal. No respiratory distress.     Breath sounds: Normal breath sounds. No wheezing.  Abdominal:     General: Abdomen is flat. Bowel sounds are normal. There is no distension.     Palpations: Abdomen is soft.     Tenderness: There is no abdominal tenderness.  Musculoskeletal:     Cervical back: Normal range of motion and neck supple.     Right knee: Swelling and bony tenderness present. Tenderness present. LCL laxity and MCL laxity present.   Lymphadenopathy:     Cervical: No cervical adenopathy.  Skin:    General: Skin is warm and dry.     Findings: No erythema.  Neurological:     General: No focal deficit present.     Mental Status: She is alert and oriented to person, place, and time.  Psychiatric:        Mood and Affect: Mood normal.        Behavior: Behavior normal.    Vital signs in last 24 hours: '@VSRANGES'$ @  Labs:   Estimated body mass index is 37.49 kg/m as calculated from the following:   Height as of 11/17/21: '5\' 2"'$  (1.575 m).   Weight as of 11/17/21: 93 kg.   Imaging Review Plain radiographs demonstrate severe degenerative joint disease of the right knee(s). The overall alignment issignificant valgus. The bone quality appears to be good for age and reported activity level.      Assessment/Plan:  End stage arthritis, right knee   The patient history, physical examination, clinical judgment of the provider and imaging studies are consistent with end stage degenerative joint disease of the right knee(s) and total knee arthroplasty is deemed medically necessary. The treatment options including medical management, injection therapy arthroscopy and arthroplasty were discussed at length. The risks and benefits of total knee arthroplasty were presented and reviewed. The risks due to aseptic loosening, infection, stiffness, patella tracking problems, thromboembolic complications and other imponderables were discussed. The patient acknowledged the explanation, agreed to proceed with the plan and consent was signed. Patient is being admitted for inpatient treatment for surgery, pain control, PT, OT, prophylactic antibiotics, VTE prophylaxis, progressive ambulation and ADL's and discharge planning. The patient is planning to be discharged home with home health services    Anticipated LOS equal to or greater than 2 midnights due to - Age 33 and older with one or more of the following:  - Obesity  - Expected need  for hospital services (PT, OT, Nursing) required for safe  discharge  - Anticipated need for postoperative skilled nursing care or inpatient rehab  - Active co-morbidities: None OR   - Unanticipated findings during/Post Surgery: None  - Patient is a high risk of re-admission due to: None

## 2022-03-28 NOTE — Care Plan (Signed)
Ortho Bundle Case Management Note  Patient Details  Name: Amy Guzman MRN: [id number] Date of Birth: October 15, 1956   Met with patient in the office prior to surgery. She will discharge to home with family to assist. HHPT referral to [facility] and OPPT set up with [facility]. Has equipment . Patient and MD in agreement with plan. CHoice offered                   DME Arranged:    DME Agency:     HH Arranged:  PT HH Agency:  [facility]  Additional Comments: Please contact me with any questions of if this plan should need to change.  Ladell Heads,  [facility] Specialist  [phone] 03/28/2022, 12:56 PM

## 2022-03-28 NOTE — Progress Notes (Addendum)
Anesthesia Review:  PCP: DR Lucianne Lei  Called surgeon off and LVMM and requested clearance.  PT states she had to see MD for clearance.  Cardiologist : none  Chest x-ray : EKG : 04/03/22  Echo : Stress test: Cardiac Cath :  Activity level: can do a flight of stairs without difficutly  Sleep Study/ CPAP : none  Fasting Blood Sugar :      / Checks Blood Sugar -- times a day:   Blood Thinner/ Instructions /Last Dose: ASA / Instructions/ Last Dose :

## 2022-03-29 NOTE — Progress Notes (Addendum)
DUE TO COVID-19 ONLY  2  VISITOR IS ALLOWED TO COME WITH YOU AND STAY IN THE WAITING ROOM ONLY DURING PRE OP AND PROCEDURE DAY OF SURGERY.   4 VISITOR  MAY VISIT WITH YOU AFTER SURGERY IN YOUR PRIVATE ROOM DURING VISITING HOURS ONLY! YOU MAY HAVE ONE PERSON SPEND THE NITE WITH YOU IN YOUR ROOM AFTER SURGERY.     Your procedure is scheduled on:         04/10/2022   Report to [facility] Main  Entrance   Report to admitting at   0515              AM DO NOT [place], PICTURE ID OR WALLET DAY OF SURGERY.      Call this number if you have problems the morning of surgery [phone]    REMEMBER: NO  SOLID FOODS , CANDY, GUM OR MINTS AFTER [place] .       Marland Kitchen CLEAR LIQUIDS UNTIL    0415am             DAY OF SURGERY.    Please complete ensure preop drink by 0415am morning of surgery.      CLEAR LIQUID DIET   Foods Allowed      WATER BLACK COFFEE ( SUGAR OK, NO MILK, CREAM OR CREAMER) REGULAR AND DECAF  TEA ( SUGAR OK NO MILK, CREAM, OR CREAMER) REGULAR AND DECAF  PLAIN JELLO ( NO RED)  FRUIT ICES ( NO RED, NO FRUIT PULP)  POPSICLES ( NO RED)  JUICE- APPLE, WHITE GRAPE AND WHITE CRANBERRY  SPORT DRINK LIKE GATORADE ( NO RED)  CLEAR BROTH ( VEGETABLE , CHICKEN OR BEEF)                                                                     BRUSH YOUR TEETH MORNING OF SURGERY AND RINSE YOUR MOUTH OUT, NO CHEWING GUM CANDY OR MINTS.     Take these medicines the morning of surgery with A SIP OF WATER:  allopurinol    DO NOT TAKE ANY DIABETIC MEDICATIONS DAY OF YOUR SURGERY                               You may not have any metal on your body including hair pins and              piercings  Do not wear jewelry, make-up, lotions, powders or perfumes, deodorant             Do not wear nail polish on your fingernails.              IF YOU ARE A FEMALE AND WANT TO SHAVE UNDER ARMS OR LEGS PRIOR TO SURGERY YOU MUST DO SO AT LEAST 48 HOURS PRIOR TO  SURGERY.              Men may shave face and neck.   Do not bring valuables to the hospital. [place].  Contacts, dentures or bridgework may not be worn into surgery.  Leave suitcase in the car. After surgery it may be brought to your room.     Patients discharged the day of surgery will not be allowed to drive home. IF YOU ARE HAVING SURGERY AND GOING HOME THE SAME DAY, YOU MUST HAVE AN ADULT TO DRIVE YOU HOME AND BE WITH YOU FOR 24 HOURS. YOU MAY GO HOME BY TAXI OR UBER OR ORTHERWISE, BUT AN ADULT MUST ACCOMPANY YOU HOME AND STAY WITH YOU FOR 24 HOURS.                Please read over the following fact sheets you were given: _____________________________________________________________________  [facility] - Preparing for Surgery Before surgery, you can play an important role.  Because skin is not sterile, your skin needs to be as free of germs as possible.  You can reduce the number of germs on your skin by washing with CHG (chlorahexidine gluconate) soap before surgery.  CHG is an antiseptic cleaner which kills germs and bonds with the skin to continue killing germs even after washing. Please DO NOT use if you have an allergy to CHG or antibacterial soaps.  If your skin becomes reddened/irritated stop using the CHG and inform your nurse when you arrive at Short Stay. Do not shave (including legs and underarms) for at least 48 hours prior to the first CHG shower.  You may shave your face/neck. Please follow these instructions carefully:  1.  Shower with CHG Soap the night before surgery and the  morning of Surgery.  2.  If you choose to wash your hair, wash your hair first as usual with your  normal  shampoo.  3.  After you shampoo, rinse your hair and body thoroughly to remove the  shampoo.                           4.  Use CHG as you would any other liquid soap.  You can apply chg directly  to the skin and wash                       Gently  with a scrungie or clean washcloth.  5.  Apply the CHG Soap to your body ONLY FROM THE NECK DOWN.   Do not use on face/ open                           Wound or open sores. Avoid contact with eyes, ears mouth and genitals (private parts).                       Wash face,  Genitals (private parts) with your normal soap.             6.  Wash thoroughly, paying special attention to the area where your surgery  will be performed.  7.  Thoroughly rinse your body with warm water from the neck down.  8.  DO NOT shower/wash with your normal soap after using and rinsing off  the CHG Soap.                9.  Pat yourself dry with a clean towel.            10.  Wear clean pajamas.            11.  Place clean sheets on your bed the night of your first  shower and do not  sleep with pets. Day of Surgery : Do not apply any lotions/deodorants the morning of surgery.  Please wear clean clothes to the hospital/surgery center.  FAILURE TO FOLLOW THESE INSTRUCTIONS MAY RESULT IN THE CANCELLATION OF YOUR SURGERY PATIENT SIGNATURE_________________________________  NURSE SIGNATURE__________________________________  ________________________________________________________________________

## 2022-03-31 ENCOUNTER — Other Ambulatory Visit: Payer: Self-pay

## 2022-03-31 ENCOUNTER — Encounter (HOSPITAL_COMMUNITY): Payer: Self-pay

## 2022-03-31 ENCOUNTER — Encounter (HOSPITAL_COMMUNITY)
Admission: RE | Admit: 2022-03-31 | Discharge: 2022-03-31 | Disposition: A | Discharge status: Home / Self Care | Payer: Medicare PPO | Source: Ambulatory Visit | Attending: Orthopedic Surgery | Admitting: Orthopedic Surgery

## 2022-03-31 DIAGNOSIS — Z01818 Encounter for other preprocedural examination: Secondary | ICD-10-CM | POA: Diagnosis not present

## 2022-03-31 DIAGNOSIS — M25561 Pain in right knee: Secondary | ICD-10-CM | POA: Diagnosis not present

## 2022-03-31 DIAGNOSIS — G8929 Other chronic pain: Secondary | ICD-10-CM | POA: Diagnosis not present

## 2022-03-31 HISTORY — DX: Gastro-esophageal reflux disease without esophagitis: K21.9

## 2022-03-31 HISTORY — DX: Other specified postprocedural states: R11.2

## 2022-03-31 HISTORY — DX: Pneumonia, unspecified organism: J18.9

## 2022-03-31 HISTORY — DX: Anxiety disorder, unspecified: F41.9

## 2022-03-31 HISTORY — DX: Other specified postprocedural states: Z98.890

## 2022-03-31 HISTORY — DX: Unspecified osteoarthritis, unspecified site: M19.90

## 2022-03-31 LAB — COMPREHENSIVE METABOLIC PANEL
ALT: 23 U/L (ref 0–44)
AST: 26 U/L (ref 15–41)
Albumin: 4.2 g/dL (ref 3.5–5.0)
Alkaline Phosphatase: 47 U/L (ref 38–126)
Anion gap: 12 (ref 5–15)
BUN: 24 mg/dL — ABNORMAL HIGH (ref 8–23)
CO2: 27 mmol/L (ref 22–32)
Calcium: 10 mg/dL (ref 8.9–10.3)
Chloride: 98 mmol/L (ref 98–111)
Creatinine, Ser: 0.92 mg/dL (ref 0.44–1.00)
GFR, Estimated: >60 mL/min (ref >60)
Glucose, Bld: 104 mg/dL — ABNORMAL HIGH (ref 70–99)
Potassium: 4 mmol/L (ref 3.5–5.1)
Sodium: 137 mmol/L (ref 135–145)
Total Bilirubin: 1.2 mg/dL (ref 0.3–1.2)
Total Protein: 7.9 g/dL (ref 6.5–8.1)

## 2022-03-31 LAB — CBC WITH DIFFERENTIAL/PLATELET
Abs Immature Granulocytes: 0.03 K/uL (ref 0.00–0.07)
Basophils Absolute: 0 K/uL (ref 0.0–0.1)
Basophils Relative: 0 %
Eosinophils Absolute: 0.1 K/uL (ref 0.0–0.5)
Eosinophils Relative: 1 %
HCT: 39.7 % (ref 36.0–46.0)
Hemoglobin: 13.7 g/dL (ref 12.0–15.0)
Immature Granulocytes: 0 %
Lymphocytes Relative: 16 %
Lymphs Abs: 1.6 K/uL (ref 0.7–4.0)
MCH: 32.4 pg (ref 26.0–34.0)
MCHC: 34.5 g/dL (ref 30.0–36.0)
MCV: 93.9 fL (ref 80.0–100.0)
Monocytes Absolute: 0.7 K/uL (ref 0.1–1.0)
Monocytes Relative: 7 %
Neutro Abs: 7.6 K/uL (ref 1.7–7.7)
Neutrophils Relative %: 76 %
Platelets: 249 K/uL (ref 150–400)
RBC: 4.23 MIL/uL (ref 3.87–5.11)
RDW: 13.1 % (ref 11.5–15.5)
WBC: 9.9 K/uL (ref 4.0–10.5)
nRBC: 0 % (ref 0.0–0.2)

## 2022-03-31 LAB — SURGICAL PCR SCREEN
MRSA, PCR: NEGATIVE
Staphylococcus aureus: NEGATIVE

## 2022-04-09 MED ORDER — TRANEXAMIC ACID 1000 MG/10ML IV SOLN
2000.0000 mg | INTRAVENOUS | Status: DC
  Filled 2022-04-09: qty 20

## 2022-04-09 NOTE — Anesthesia Preprocedure Evaluation (Signed)
Anesthesia Evaluation  Patient identified by MRN, date of birth, ID band Patient awake    Reviewed: Allergy & Precautions, NPO status , Patient's Chart, lab work & pertinent test results  Airway Mallampati: III       Dental  (+) Edentulous Upper, Edentulous Lower   Pulmonary neg pulmonary ROS,    Pulmonary exam normal        Cardiovascular hypertension, Pt. on medications Normal cardiovascular exam  ECG: NSR, rate 93   Neuro/Psych Anxiety negative neurological ROS     GI/Hepatic negative GI ROS, Neg liver ROS,   Endo/Other  negative endocrine ROS  Renal/GU negative Renal ROS     Musculoskeletal  (+) Arthritis , Gout   Abdominal (+) + obese,   Peds  Hematology negative hematology ROS (+)   Anesthesia Other Findings OA RIGHT KNEE  Reproductive/Obstetrics                            Anesthesia Physical Anesthesia Plan  ASA: 2  Anesthesia Plan: Spinal and Regional   Post-op Pain Management:    Induction: Intravenous  PONV Risk Score and Plan: 2 and Ondansetron, Dexamethasone, Propofol infusion, Midazolam and Treatment may vary due to age or medical condition  Airway Management Planned: Simple Face Mask  Additional Equipment:   Intra-op Plan:   Post-operative Plan:   Informed Consent: I have reviewed the patients History and Physical, chart, labs and discussed the procedure including the risks, benefits and alternatives for the proposed anesthesia with the patient or authorized representative who has indicated his/her understanding and acceptance.       Plan Discussed with: CRNA  Anesthesia Plan Comments:         Anesthesia Quick Evaluation

## 2022-04-10 ENCOUNTER — Ambulatory Visit (HOSPITAL_COMMUNITY): Payer: Medicare PPO | Admitting: Physician Assistant

## 2022-04-10 ENCOUNTER — Other Ambulatory Visit: Payer: Self-pay

## 2022-04-10 ENCOUNTER — Encounter (HOSPITAL_COMMUNITY)
Admission: RE | Disposition: A | Discharge status: Home Health | Payer: Self-pay | Source: Ambulatory Visit | Attending: Orthopedic Surgery

## 2022-04-10 ENCOUNTER — Encounter (HOSPITAL_COMMUNITY): Payer: Self-pay | Admitting: Orthopedic Surgery

## 2022-04-10 ENCOUNTER — Ambulatory Visit (HOSPITAL_BASED_OUTPATIENT_CLINIC_OR_DEPARTMENT_OTHER): Payer: Medicare PPO | Admitting: Anesthesiology

## 2022-04-10 ENCOUNTER — Observation Stay (HOSPITAL_COMMUNITY)
Admission: RE | Admit: 2022-04-10 | Discharge: 2022-04-11 | Disposition: A | Discharge status: Home Health | Payer: Medicare PPO | Source: Ambulatory Visit | Attending: Orthopedic Surgery | Admitting: Orthopedic Surgery

## 2022-04-10 ENCOUNTER — Ambulatory Visit (HOSPITAL_COMMUNITY): Payer: Medicare PPO

## 2022-04-10 DIAGNOSIS — Z8701 Personal history of pneumonia (recurrent): Secondary | ICD-10-CM | POA: Diagnosis not present

## 2022-04-10 DIAGNOSIS — Z6837 Body mass index (BMI) 37.0-37.9, adult: Secondary | ICD-10-CM | POA: Insufficient documentation

## 2022-04-10 DIAGNOSIS — M25461 Effusion, right knee: Secondary | ICD-10-CM | POA: Insufficient documentation

## 2022-04-10 DIAGNOSIS — Z803 Family history of malignant neoplasm of breast: Secondary | ICD-10-CM | POA: Diagnosis not present

## 2022-04-10 DIAGNOSIS — R21 Rash and other nonspecific skin eruption: Secondary | ICD-10-CM | POA: Insufficient documentation

## 2022-04-10 DIAGNOSIS — I10 Essential (primary) hypertension: Secondary | ICD-10-CM | POA: Diagnosis not present

## 2022-04-10 DIAGNOSIS — Z853 Personal history of malignant neoplasm of breast: Secondary | ICD-10-CM | POA: Insufficient documentation

## 2022-04-10 DIAGNOSIS — G8918 Other acute postprocedural pain: Secondary | ICD-10-CM | POA: Diagnosis not present

## 2022-04-10 DIAGNOSIS — M1711 Unilateral primary osteoarthritis, right knee: Secondary | ICD-10-CM | POA: Diagnosis not present

## 2022-04-10 DIAGNOSIS — Z96651 Presence of right artificial knee joint: Secondary | ICD-10-CM | POA: Diagnosis not present

## 2022-04-10 DIAGNOSIS — K219 Gastro-esophageal reflux disease without esophagitis: Secondary | ICD-10-CM | POA: Insufficient documentation

## 2022-04-10 DIAGNOSIS — M25561 Pain in right knee: Secondary | ICD-10-CM | POA: Insufficient documentation

## 2022-04-10 DIAGNOSIS — Z79899 Other long term (current) drug therapy: Secondary | ICD-10-CM | POA: Diagnosis not present

## 2022-04-10 DIAGNOSIS — E669 Obesity, unspecified: Secondary | ICD-10-CM | POA: Insufficient documentation

## 2022-04-10 DIAGNOSIS — F419 Anxiety disorder, unspecified: Secondary | ICD-10-CM | POA: Insufficient documentation

## 2022-04-10 DIAGNOSIS — Z01818 Encounter for other preprocedural examination: Secondary | ICD-10-CM

## 2022-04-10 DIAGNOSIS — E785 Hyperlipidemia, unspecified: Secondary | ICD-10-CM | POA: Insufficient documentation

## 2022-04-10 DIAGNOSIS — Z471 Aftercare following joint replacement surgery: Secondary | ICD-10-CM | POA: Diagnosis not present

## 2022-04-10 HISTORY — PX: TOTAL KNEE ARTHROPLASTY: SHX125

## 2022-04-10 LAB — TYPE AND SCREEN
ABO/RH(D): A POS
Antibody Screen: NEGATIVE

## 2022-04-10 LAB — ABO/RH: ABO/RH(D): A POS

## 2022-04-10 SURGERY — ARTHROPLASTY, KNEE, TOTAL
Anesthesia: Regional | Site: Knee | Laterality: Right

## 2022-04-10 MED ORDER — HYDROMORPHONE HCL 1 MG/ML IJ SOLN
INTRAMUSCULAR | Status: DC | PRN
  Administered 2022-04-10 (×2): 1 mg via INTRAVENOUS

## 2022-04-10 MED ORDER — TRANEXAMIC ACID-NACL 1000-0.7 MG/100ML-% IV SOLN
1000.0000 mg | INTRAVENOUS | Status: AC
  Administered 2022-04-10: 1000 mg via INTRAVENOUS
  Filled 2022-04-10: qty 100

## 2022-04-10 MED ORDER — SUCCINYLCHOLINE CHLORIDE 200 MG/10ML IV SOSY
PREFILLED_SYRINGE | INTRAVENOUS | Status: DC | PRN
  Administered 2022-04-10: 100 mg via INTRAVENOUS

## 2022-04-10 MED ORDER — ONDANSETRON HCL 4 MG PO TABS: 4.0000 mg | ORAL_TABLET | Freq: Three times a day (TID) | ORAL | 0 refills | Status: AC | PRN

## 2022-04-10 MED ORDER — BUPIVACAINE-EPINEPHRINE (PF) 0.5% -1:200000 IJ SOLN
INTRAMUSCULAR | Status: DC | PRN
  Administered 2022-04-10: 30 mL via PERINEURAL

## 2022-04-10 MED ORDER — BUPIVACAINE LIPOSOME 1.3 % IJ SUSP
INTRAMUSCULAR | Status: DC | PRN
  Administered 2022-04-10: 20 mL

## 2022-04-10 MED ORDER — PROPOFOL 10 MG/ML IV BOLUS
INTRAVENOUS | Status: AC
  Filled 2022-04-10: qty 20

## 2022-04-10 MED ORDER — HYDROCHLOROTHIAZIDE 12.5 MG PO TABS
12.5000 mg | ORAL_TABLET | Freq: Every day | ORAL | Status: DC
  Administered 2022-04-11: 12.5 mg via ORAL
  Filled 2022-04-10: qty 1

## 2022-04-10 MED ORDER — LACTATED RINGERS IV SOLN: INTRAVENOUS | Status: DC

## 2022-04-10 MED ORDER — METHOCARBAMOL 500 MG PO TABS: 500.0000 mg | ORAL_TABLET | Freq: Four times a day (QID) | ORAL | Status: DC | PRN

## 2022-04-10 MED ORDER — ACETAMINOPHEN 500 MG PO TABS: 1000.0000 mg | ORAL_TABLET | Freq: Three times a day (TID) | ORAL | 0 refills | Status: AC | PRN

## 2022-04-10 MED ORDER — AMISULPRIDE (ANTIEMETIC) 5 MG/2ML IV SOLN: 10.0000 mg | Freq: Once | INTRAVENOUS | Status: DC | PRN

## 2022-04-10 MED ORDER — PHENOL 1.4 % MT LIQD: 1.0000 | OROMUCOSAL | Status: DC | PRN

## 2022-04-10 MED ORDER — PHENYLEPHRINE 80 MCG/ML (10ML) SYRINGE FOR IV PUSH (FOR BLOOD PRESSURE SUPPORT)
PREFILLED_SYRINGE | INTRAVENOUS | Status: DC | PRN
  Administered 2022-04-10: 80 ug via INTRAVENOUS

## 2022-04-10 MED ORDER — ONDANSETRON HCL 4 MG/2ML IJ SOLN
INTRAMUSCULAR | Status: AC
  Filled 2022-04-10: qty 2

## 2022-04-10 MED ORDER — ACETAMINOPHEN 500 MG PO TABS
1000.0000 mg | ORAL_TABLET | Freq: Once | ORAL | Status: AC
  Administered 2022-04-10: 1000 mg via ORAL
  Filled 2022-04-10: qty 2

## 2022-04-10 MED ORDER — FENTANYL CITRATE PF 50 MCG/ML IJ SOSY: 25.0000 ug | PREFILLED_SYRINGE | INTRAMUSCULAR | Status: DC | PRN

## 2022-04-10 MED ORDER — ROSUVASTATIN CALCIUM 10 MG PO TABS
10.0000 mg | ORAL_TABLET | Freq: Every day | ORAL | Status: DC
  Administered 2022-04-11: 10 mg via ORAL
  Filled 2022-04-10: qty 1

## 2022-04-10 MED ORDER — CEFAZOLIN SODIUM-DEXTROSE 2-4 GM/100ML-% IV SOLN
2.0000 g | INTRAVENOUS | Status: AC
  Administered 2022-04-10: 2 g via INTRAVENOUS
  Filled 2022-04-10: qty 100

## 2022-04-10 MED ORDER — MIDAZOLAM HCL 5 MG/5ML IJ SOLN
INTRAMUSCULAR | Status: DC | PRN
  Administered 2022-04-10: 2 mg via INTRAVENOUS

## 2022-04-10 MED ORDER — POLYETHYLENE GLYCOL 3350 17 G PO PACK: 17.0000 g | PACK | Freq: Every day | ORAL | Status: DC | PRN

## 2022-04-10 MED ORDER — ACETAMINOPHEN 325 MG PO TABS: 325.0000 mg | ORAL_TABLET | Freq: Four times a day (QID) | ORAL | Status: DC | PRN

## 2022-04-10 MED ORDER — MENTHOL 3 MG MT LOZG: 1.0000 | LOZENGE | OROMUCOSAL | Status: DC | PRN

## 2022-04-10 MED ORDER — SODIUM CHLORIDE (PF) 0.9 % IJ SOLN
INTRAMUSCULAR | Status: AC
  Filled 2022-04-10: qty 50

## 2022-04-10 MED ORDER — DEXAMETHASONE SODIUM PHOSPHATE 10 MG/ML IJ SOLN
INTRAMUSCULAR | Status: AC
Start: 2022-04-10 — End: ?
  Filled 2022-04-10: qty 1

## 2022-04-10 MED ORDER — ACETAMINOPHEN 500 MG PO TABS
1000.0000 mg | ORAL_TABLET | Freq: Four times a day (QID) | ORAL | Status: AC
  Administered 2022-04-10 – 2022-04-11 (×4): 1000 mg via ORAL
  Filled 2022-04-10 (×4): qty 2

## 2022-04-10 MED ORDER — CHLORHEXIDINE GLUCONATE 0.12 % MT SOLN
15.0000 mL | Freq: Once | OROMUCOSAL | Status: AC
Start: 2022-04-10 — End: 2022-04-10
  Administered 2022-04-10: 15 mL via OROMUCOSAL

## 2022-04-10 MED ORDER — BUPIVACAINE LIPOSOME 1.3 % IJ SUSP
INTRAMUSCULAR | Status: AC
Start: 2022-04-10 — End: ?
  Filled 2022-04-10: qty 20

## 2022-04-10 MED ORDER — PROPOFOL 10 MG/ML IV BOLUS
INTRAVENOUS | Status: DC | PRN
  Administered 2022-04-10: 150 mg via INTRAVENOUS
  Administered 2022-04-10: 100 mg via INTRAVENOUS

## 2022-04-10 MED ORDER — ONDANSETRON HCL 4 MG PO TABS: 4.0000 mg | ORAL_TABLET | Freq: Four times a day (QID) | ORAL | Status: DC | PRN

## 2022-04-10 MED ORDER — LACTATED RINGERS IV BOLUS
500.0000 mL | Freq: Once | INTRAVENOUS | Status: AC
  Administered 2022-04-10: 500 mL via INTRAVENOUS

## 2022-04-10 MED ORDER — OXYCODONE HCL 5 MG PO TABS
ORAL_TABLET | ORAL | Status: AC
  Filled 2022-04-10: qty 1

## 2022-04-10 MED ORDER — DEXAMETHASONE SODIUM PHOSPHATE 10 MG/ML IJ SOLN
INTRAMUSCULAR | Status: DC | PRN
  Administered 2022-04-10: 10 mg via INTRAVENOUS

## 2022-04-10 MED ORDER — KETOROLAC TROMETHAMINE 15 MG/ML IJ SOLN
INTRAMUSCULAR | Status: AC
  Filled 2022-04-10: qty 1

## 2022-04-10 MED ORDER — LIDOCAINE HCL (PF) 2 % IJ SOLN
INTRAMUSCULAR | Status: AC
  Filled 2022-04-10: qty 5

## 2022-04-10 MED ORDER — SODIUM CHLORIDE 0.9 % IV SOLN: INTRAVENOUS | Status: DC

## 2022-04-10 MED ORDER — METHOCARBAMOL 500 MG IVPB - SIMPLE MED
500.0000 mg | Freq: Four times a day (QID) | INTRAVENOUS | Status: DC | PRN
  Administered 2022-04-10: 500 mg via INTRAVENOUS
  Filled 2022-04-10: qty 500

## 2022-04-10 MED ORDER — DIPHENHYDRAMINE HCL 12.5 MG/5ML PO ELIX: 12.5000 mg | ORAL_SOLUTION | ORAL | Status: DC | PRN

## 2022-04-10 MED ORDER — DOCUSATE SODIUM 100 MG PO CAPS
100.0000 mg | ORAL_CAPSULE | Freq: Two times a day (BID) | ORAL | Status: DC
Start: 2022-04-10 — End: 2022-04-11
  Administered 2022-04-10 – 2022-04-11 (×2): 100 mg via ORAL
  Filled 2022-04-10 (×3): qty 1

## 2022-04-10 MED ORDER — 0.9 % SODIUM CHLORIDE (POUR BTL) OPTIME
TOPICAL | Status: DC | PRN
  Administered 2022-04-10: 1000 mL

## 2022-04-10 MED ORDER — ALCOHOL (RUBBING) 70 % SOLN
Status: DC | PRN
  Administered 2022-04-10: 50 mL via TOPICAL

## 2022-04-10 MED ORDER — OXYCODONE HCL 5 MG PO TABS
5.0000 mg | ORAL_TABLET | ORAL | Status: DC | PRN
  Administered 2022-04-10: 5 mg via ORAL
  Administered 2022-04-10 – 2022-04-11 (×3): 10 mg via ORAL
  Filled 2022-04-10 (×3): qty 2

## 2022-04-10 MED ORDER — HYDROMORPHONE HCL 2 MG/ML IJ SOLN
INTRAMUSCULAR | Status: AC
  Filled 2022-04-10: qty 1

## 2022-04-10 MED ORDER — FENTANYL CITRATE (PF) 100 MCG/2ML IJ SOLN
INTRAMUSCULAR | Status: AC
  Filled 2022-04-10: qty 2

## 2022-04-10 MED ORDER — MIDAZOLAM HCL 2 MG/2ML IJ SOLN
INTRAMUSCULAR | Status: AC
Start: 2022-04-10 — End: ?
  Filled 2022-04-10: qty 2

## 2022-04-10 MED ORDER — IRBESARTAN 75 MG PO TABS
75.0000 mg | ORAL_TABLET | Freq: Every day | ORAL | Status: DC
  Administered 2022-04-11: 75 mg via ORAL
  Filled 2022-04-10: qty 1

## 2022-04-10 MED ORDER — ASPIRIN 81 MG PO CHEW
81.0000 mg | CHEWABLE_TABLET | Freq: Two times a day (BID) | ORAL | Status: DC
  Administered 2022-04-10 – 2022-04-11 (×2): 81 mg via ORAL
  Filled 2022-04-10 (×2): qty 1

## 2022-04-10 MED ORDER — LACTATED RINGERS IV BOLUS: 250.0000 mL | Freq: Once | INTRAVENOUS | Status: DC

## 2022-04-10 MED ORDER — ORAL CARE MOUTH RINSE: 15.0000 mL | Freq: Once | OROMUCOSAL | Status: AC

## 2022-04-10 MED ORDER — CEFAZOLIN SODIUM-DEXTROSE 2-4 GM/100ML-% IV SOLN
INTRAVENOUS | Status: AC
  Filled 2022-04-10: qty 100

## 2022-04-10 MED ORDER — METHOCARBAMOL 500 MG PO TABS: 500.0000 mg | ORAL_TABLET | Freq: Three times a day (TID) | ORAL | 0 refills | Status: AC | PRN

## 2022-04-10 MED ORDER — SUCCINYLCHOLINE CHLORIDE 200 MG/10ML IV SOSY
PREFILLED_SYRINGE | INTRAVENOUS | Status: AC
  Filled 2022-04-10: qty 10

## 2022-04-10 MED ORDER — HYDROMORPHONE HCL 1 MG/ML IJ SOLN
0.5000 mg | INTRAMUSCULAR | Status: DC | PRN
  Filled 2022-04-10: qty 1

## 2022-04-10 MED ORDER — ALLOPURINOL 100 MG PO TABS
100.0000 mg | ORAL_TABLET | Freq: Every day | ORAL | Status: DC
  Administered 2022-04-11: 100 mg via ORAL
  Filled 2022-04-10: qty 1

## 2022-04-10 MED ORDER — PHENYLEPHRINE 80 MCG/ML (10ML) SYRINGE FOR IV PUSH (FOR BLOOD PRESSURE SUPPORT)
PREFILLED_SYRINGE | INTRAVENOUS | Status: AC
  Filled 2022-04-10: qty 10

## 2022-04-10 MED ORDER — POVIDONE-IODINE 10 % EX SWAB
2.0000 | Freq: Once | CUTANEOUS | Status: AC
  Administered 2022-04-10: 2 via TOPICAL

## 2022-04-10 MED ORDER — LACTATED RINGERS IV BOLUS
250.0000 mL | Freq: Once | INTRAVENOUS | Status: AC
  Administered 2022-04-10: 250 mL via INTRAVENOUS

## 2022-04-10 MED ORDER — FENTANYL CITRATE (PF) 100 MCG/2ML IJ SOLN
INTRAMUSCULAR | Status: DC | PRN
  Administered 2022-04-10 (×2): 50 ug via INTRAVENOUS

## 2022-04-10 MED ORDER — PROPOFOL 1000 MG/100ML IV EMUL
INTRAVENOUS | Status: AC
Start: 2022-04-10 — End: ?
  Filled 2022-04-10: qty 100

## 2022-04-10 MED ORDER — PANTOPRAZOLE SODIUM 40 MG PO TBEC
40.0000 mg | DELAYED_RELEASE_TABLET | Freq: Every day | ORAL | Status: DC
  Administered 2022-04-10 – 2022-04-11 (×2): 40 mg via ORAL
  Filled 2022-04-10 (×2): qty 1

## 2022-04-10 MED ORDER — ZOLPIDEM TARTRATE 5 MG PO TABS: 5.0000 mg | ORAL_TABLET | Freq: Every evening | ORAL | Status: DC | PRN

## 2022-04-10 MED ORDER — ONDANSETRON HCL 4 MG/2ML IJ SOLN: 4.0000 mg | Freq: Four times a day (QID) | INTRAMUSCULAR | Status: DC | PRN

## 2022-04-10 MED ORDER — ASPIRIN 81 MG PO TBEC: 81.0000 mg | DELAYED_RELEASE_TABLET | Freq: Two times a day (BID) | ORAL | 0 refills | Status: AC

## 2022-04-10 MED ORDER — BUPIVACAINE IN DEXTROSE 0.75-8.25 % IT SOLN
INTRATHECAL | Status: DC | PRN
  Administered 2022-04-10: 1.8 mL via INTRATHECAL

## 2022-04-10 MED ORDER — BUPIVACAINE LIPOSOME 1.3 % IJ SUSP: 20.0000 mL | Freq: Once | INTRAMUSCULAR | Status: DC

## 2022-04-10 MED ORDER — SODIUM CHLORIDE (PF) 0.9 % IJ SOLN
INTRAMUSCULAR | Status: AC
  Filled 2022-04-10: qty 10

## 2022-04-10 MED ORDER — CEFAZOLIN SODIUM-DEXTROSE 2-4 GM/100ML-% IV SOLN
2.0000 g | Freq: Four times a day (QID) | INTRAVENOUS | Status: AC
  Administered 2022-04-10 (×2): 2 g via INTRAVENOUS
  Filled 2022-04-10: qty 100

## 2022-04-10 MED ORDER — SODIUM CHLORIDE 0.9% FLUSH
INTRAVENOUS | Status: DC | PRN
  Administered 2022-04-10: 60 mL

## 2022-04-10 MED ORDER — KETOROLAC TROMETHAMINE 15 MG/ML IJ SOLN
7.5000 mg | Freq: Four times a day (QID) | INTRAMUSCULAR | Status: AC
  Administered 2022-04-10 – 2022-04-11 (×4): 7.5 mg via INTRAVENOUS
  Filled 2022-04-10 (×3): qty 1

## 2022-04-10 MED ORDER — WATER FOR IRRIGATION, STERILE IR SOLN
Status: DC | PRN
  Administered 2022-04-10: 2000 mL

## 2022-04-10 MED ORDER — SODIUM CHLORIDE 0.9 % IR SOLN
Status: DC | PRN
  Administered 2022-04-10: 1000 mL

## 2022-04-10 MED ORDER — VALSARTAN-HYDROCHLOROTHIAZIDE 80-12.5 MG PO TABS
1.0000 | ORAL_TABLET | Freq: Every day | ORAL | Status: DC
Start: 2022-04-11 — End: 2022-04-10

## 2022-04-10 MED ORDER — PROPOFOL 500 MG/50ML IV EMUL
INTRAVENOUS | Status: DC | PRN
  Administered 2022-04-10: 50 ug/kg/min via INTRAVENOUS

## 2022-04-10 MED ORDER — ONDANSETRON HCL 4 MG/2ML IJ SOLN: 4.0000 mg | Freq: Once | INTRAMUSCULAR | Status: DC | PRN

## 2022-04-10 MED ORDER — OXYCODONE HCL 5 MG PO TABS: 5.0000 mg | ORAL_TABLET | ORAL | 0 refills | Status: AC | PRN

## 2022-04-10 SURGICAL SUPPLY — 64 items
ADH SKN CLS APL DERMABOND .7 (GAUZE/BANDAGES/DRESSINGS) ×2
APL PRP STRL LF DISP 70% ISPRP (MISCELLANEOUS) ×2
BAG COUNTER SPONGE SURGICOUNT (BAG) ×1 IMPLANT
BAG SPNG CNTER NS LX DISP (BAG) ×1
BLADE SAG 18X100X1.27 (BLADE) ×2 IMPLANT
BLADE SAW SAG 35X64 .89 (BLADE) ×2 IMPLANT
BNDG CMPR 5X3 CHSV STRCH STRL (GAUZE/BANDAGES/DRESSINGS) ×1
BNDG CMPR MED 10X6 ELC LF (GAUZE/BANDAGES/DRESSINGS) ×1
BNDG COHESIVE 3X5 TAN ST LF (GAUZE/BANDAGES/DRESSINGS) ×2 IMPLANT
BNDG ELASTIC 6X10 VLCR STRL LF (GAUZE/BANDAGES/DRESSINGS) ×2 IMPLANT
BOWL SMART MIX CTS (DISPOSABLE) ×2 IMPLANT
BSPLAT TIB 5D D CMNT STM RT (Knees) ×1 IMPLANT
CEMENT BONE R 1X40 (Cement) ×2 IMPLANT
CHLORAPREP W/TINT 26 (MISCELLANEOUS) ×4 IMPLANT
COMP FEM CEMT PERS NARROW 6 RT (Joint) ×2 IMPLANT
COMPONENT FEM CMT PERS NRW 6RT (Joint) IMPLANT
COVER SURGICAL LIGHT HANDLE (MISCELLANEOUS) ×2 IMPLANT
CUFF TOURN SGL QUICK 34 (TOURNIQUET CUFF) ×2
CUFF TRNQT CYL 34X4.125X (TOURNIQUET CUFF) ×1 IMPLANT
DERMABOND ADVANCED (GAUZE/BANDAGES/DRESSINGS) ×2
DERMABOND ADVANCED .7 DNX12 (GAUZE/BANDAGES/DRESSINGS) ×1 IMPLANT
DRAPE INCISE IOBAN 85X60 (DRAPES) ×2 IMPLANT
DRAPE SHEET LG 3/4 BI-LAMINATE (DRAPES) ×2 IMPLANT
DRAPE U-SHAPE 47X51 STRL (DRAPES) ×2 IMPLANT
DRESSING AQUACEL AG SP 3.5X10 (GAUZE/BANDAGES/DRESSINGS) ×1 IMPLANT
DRSG AQUACEL AG SP 3.5X10 (GAUZE/BANDAGES/DRESSINGS) ×2
ELECT REM PT RETURN 15FT ADLT (MISCELLANEOUS) ×2 IMPLANT
GAUZE SPONGE 4X4 12PLY STRL (GAUZE/BANDAGES/DRESSINGS) ×2 IMPLANT
GLOVE BIOGEL PI IND STRL 8 (GLOVE) ×1 IMPLANT
GLOVE BIOGEL PI INDICATOR 8 (GLOVE) ×1
GLOVE SURG ORTHO 8.0 STRL STRW (GLOVE) ×4 IMPLANT
GOWN STRL REUS W/ TWL XL LVL3 (GOWN DISPOSABLE) ×1 IMPLANT
GOWN STRL REUS W/TWL XL LVL3 (GOWN DISPOSABLE) ×2
HANDPIECE INTERPULSE COAX TIP (DISPOSABLE) ×2
HDLS TROCR DRIL PIN KNEE 75 (PIN) ×2
HOLDER FOLEY CATH W/STRAP (MISCELLANEOUS) ×2 IMPLANT
HOOD PEEL AWAY FLYTE STAYCOOL (MISCELLANEOUS) ×6 IMPLANT
INSERT TIB ASF VIV SZ 6-7 12H (Insert) ×1 IMPLANT
MANIFOLD NEPTUNE II (INSTRUMENTS) ×2 IMPLANT
MARKER SKIN DUAL TIP RULER LAB (MISCELLANEOUS) ×2 IMPLANT
NS IRRIG 1000ML POUR BTL (IV SOLUTION) ×2 IMPLANT
PACK TOTAL KNEE CUSTOM (KITS) ×2 IMPLANT
PIN DRILL HDLS TROCAR 75 4PK (PIN) IMPLANT
PROTECTOR NERVE ULNAR (MISCELLANEOUS) ×2 IMPLANT
SCREW HEADED 33MM KNEE (MISCELLANEOUS) ×2 IMPLANT
SET HNDPC FAN SPRY TIP SCT (DISPOSABLE) ×1 IMPLANT
SOLUTION IRRIG SURGIPHOR (IV SOLUTION) IMPLANT
SPIKE FLUID TRANSFER (MISCELLANEOUS) ×2 IMPLANT
STEM POLY PAT PLY 32M KNEE (Knees) ×1 IMPLANT
STEM TIBIA 5 DEG SZ D R KNEE (Knees) IMPLANT
STRIP CLOSURE SKIN 1/2X4 (GAUZE/BANDAGES/DRESSINGS) ×2 IMPLANT
SUT MNCRL AB 3-0 PS2 18 (SUTURE) ×2 IMPLANT
SUT STRATAFIX 0 PDS 27 VIOLET (SUTURE) ×2
SUT STRATAFIX PDO 1 14 VIOLET (SUTURE) ×2
SUT STRATFX PDO 1 14 VIOLET (SUTURE) ×1
SUT VIC AB 2-0 CT2 27 (SUTURE) ×4 IMPLANT
SUTURE STRATFX 0 PDS 27 VIOLET (SUTURE) ×1 IMPLANT
SUTURE STRATFX PDO 1 14 VIOLET (SUTURE) ×1 IMPLANT
SYR 50ML LL SCALE MARK (SYRINGE) ×2 IMPLANT
TIBIA STEM 5 DEG SZ D R KNEE (Knees) ×2 IMPLANT
TRAY FOLEY MTR SLVR 14FR STAT (SET/KITS/TRAYS/PACK) ×1 IMPLANT
TUBE SUCTION HIGH CAP CLEAR NV (SUCTIONS) ×2 IMPLANT
UNDERPAD 30X36 HEAVY ABSORB (UNDERPADS AND DIAPERS) ×2 IMPLANT
WRAP KNEE MAXI GEL POST OP (GAUZE/BANDAGES/DRESSINGS) ×1 IMPLANT

## 2022-04-10 NOTE — Progress Notes (Signed)
Physical Therapy Evaluation Patient Details Name: Amy Guzman MRN: [id number] DOB: 11/04/1955 Today's Date: 04/10/2022  History of Present Illness  Pt is a 66yo female presenting s/p R-TKA on 04/10/22. PMH: GERD, hx of breast cancer, OA, HLD, HTN, PONV.  Clinical Impression  Amy Guzman is a 66 y.o. female POD 0 s/p R-TKA. Patient reports modified independence using SPC during gout flareups for mobility at baseline, often pt is independent during gout remission. Pt required min assist for bed mobility and upon sitting EOB, pt reporting nausea, wooziness, and dizziness, BP 99/81, returned to supine, RN notified, further mobility deferred. Patient instructed in exercises to facilitate ROM and circulation. Patient will benefit from continued skilled PT interventions to address impairments and progress towards PLOF. Acute PT will follow to progress mobility and HEP in preparation for safe discharge home.     Recommendations for follow up therapy are one component of a multi-disciplinary discharge planning process, led by the attending physician.  Recommendations may be updated based on patient status, additional functional criteria and insurance authorization.  Follow Up Recommendations Follow physician's recommendations for discharge plan and follow up therapies    Assistance Recommended at Discharge Intermittent Supervision/Assistance  Patient can return home with the following  A little help with walking and/or transfers;A little help with bathing/dressing/bathroom;Assistance with cooking/housework;Assist for transportation;Help with stairs or ramp for entrance    Equipment Recommendations None recommended by PT (Pt has RW and is being issued [facility].)  Recommendations for Other Services       Functional Status Assessment Patient has had a recent decline in their functional status and demonstrates the ability to make significant improvements in function in a reasonable and predictable  amount of time.     Precautions / Restrictions Precautions Precautions: None Restrictions Weight Bearing Restrictions: No      Mobility  Bed Mobility Overal bed mobility: Needs Assistance Bed Mobility: Supine to Sit, Sit to Supine     Supine to sit: Min assist Sit to supine: Min assist   General bed mobility comments: Pt assisted to sit EOB for RLE movement, also required min assist to return to supine positioning (move RLE onto bed). Upon sitting upright, pt nauseated, BP drop, and feeling groggy. Returned to supine, further mobility deferred, RN notified.    Transfers                   General transfer comment: deferred    Ambulation/Gait               General Gait Details: deferred  Stairs            Wheelchair Mobility    Modified Rankin (Stroke Patients Only)       Balance                                             Pertinent Vitals/Pain Pain Assessment Pain Assessment: 0-10 Pain Score: 3  Pain Location: right knee Pain Descriptors / Indicators: Operative site guarding, Cramping, Discomfort Pain Intervention(s): Limited activity within patient's tolerance, Monitored during session, Repositioned, Ice applied    Home Living Family/patient expects to be discharged to:: Private residence Living Arrangements: Children;Other relatives (Sister, daughter, son, grandson) Available Help at Discharge: Family;Available 24 hours/day Type of Home: House Home Access: Ramped entrance       Home Layout: One level Home Equipment: Conservation officer, nature (2 wheels);Cane -  single point      Prior Function Prior Level of Function : Independent/Modified Independent             Mobility Comments: uses SPC when gout flares up ADLs Comments: ind     Hand Dominance        Extremity/Trunk Assessment   Upper Extremity Assessment Upper Extremity Assessment: Overall WFL for tasks assessed    Lower Extremity Assessment Lower  Extremity Assessment: RLE deficits/detail;LLE deficits/detail RLE Deficits / Details: MMT ank DF/PF 3+/5, mild extensor lag noted, great difficultto perform SLR RLE Sensation: WNL LLE Deficits / Details: MMT ank DF/PF 4/5 LLE Sensation: WNL    Cervical / Trunk Assessment Cervical / Trunk Assessment: Kyphotic  Communication   Communication: No difficulties  Cognition Arousal/Alertness: Awake/alert Behavior During Therapy: WFL for tasks assessed/performed Overall Cognitive Status: Within Functional Limits for tasks assessed                                          General Comments      Exercises     Assessment/Plan    PT Assessment Patient needs continued PT services  PT Problem List Decreased strength;Decreased range of motion;Decreased activity tolerance;Decreased balance;Decreased mobility;Decreased coordination;Decreased knowledge of use of DME;Pain       PT Treatment Interventions DME instruction;Gait training;Stair training;Functional mobility training;Therapeutic activities;Therapeutic exercise;Balance training;Neuromuscular re-education;Patient/family education    PT Goals (Current goals can be found in the Care Plan section)  Acute Rehab PT Goals Patient Stated Goal: To walk without pain PT Goal Formulation: With patient Time For Goal Achievement: 04/17/22 Potential to Achieve Goals: Good    Frequency 7X/week     Co-evaluation               AM-PAC PT "6 Clicks" Mobility  Outcome Measure Help needed turning from your back to your side while in a flat bed without using bedrails?: None Help needed moving from lying on your back to sitting on the side of a flat bed without using bedrails?: A Little Help needed moving to and from a bed to a chair (including a wheelchair)?: A Little Help needed standing up from a chair using your arms (e.g., wheelchair or bedside chair)?: A Little Help needed to walk in hospital room?: A Little Help needed  climbing 3-5 steps with a railing? : A Lot 6 Click Score: 18    End of Session Equipment Utilized During Treatment: Gait belt Activity Tolerance: Treatment limited secondary to medical complications (Comment) Patient left: in bed;with call bell/phone within reach Nurse Communication: Mobility status PT Visit Diagnosis: Difficulty in walking, not elsewhere classified (R26.2);Pain Pain - Right/Left: Right Pain - part of body: Knee    Time: 2706-2376 PT Time Calculation (min) (ACUTE ONLY): 26 min   Charges:   PT Evaluation $PT Eval Low Complexity: 1 Low          Coolidge Breeze, PT, DPT [facility] Rehabilitation Department Office: [phone] Pager: [phone]  Coolidge Breeze 04/10/2022, 3:52 PM

## 2022-04-10 NOTE — Progress Notes (Signed)
Orthopedic Tech Progress Note Patient Details:  Amy Guzman Mar 26, 1956 [id number]  Ortho Devices Type of Ortho Device: Bone foam zero knee Ortho Device/Splint Location: RLE Ortho Device/Splint Interventions: Application   Post Interventions Patient Tolerated: Well  Amy Guzman 04/10/2022, 10:00 AM

## 2022-04-10 NOTE — Interval H&P Note (Signed)
The patient has been re-examined, and the chart reviewed, and there have been no interval changes to the documented history and physical.    Plan for R TKA for OA today.  The operative side was examined and the patient was confirmed to have. Sens DPN, SPN, TN intact, Motor EHL, ext, flex 5/5, and DP 2+, PT 2+, No significant edema.   The risks, benefits, and alternatives have been discussed at length with patient, and the patient is willing to proceed.  Right knee marked. Consent has been signed.

## 2022-04-10 NOTE — Transfer of Care (Signed)
Immediate Anesthesia Transfer of Care Note  Patient: Amy Guzman [facility]  Procedure(s) Performed: TOTAL KNEE ARTHROPLASTY (Right: Knee)  Patient Location: PACU  Anesthesia Type:General  Level of Consciousness: awake  Airway & Oxygen Therapy: Patient Spontanous Breathing  Post-op Assessment: Report given to RN  Post vital signs: stable  Last Vitals:  Vitals Value Taken Time  BP 118/75 04/10/22 0945  Temp    Pulse 77 04/10/22 0947  Resp 10 04/10/22 0947  SpO2 96 % 04/10/22 0947  Vitals shown include unvalidated device data.  Last Pain:  Vitals:   04/10/22 0612  TempSrc:   PainSc: 0-No pain      Patients Stated Pain Goal: 4 (25/[id number]/63 [id number])  Complications: No notable events documented.

## 2022-04-10 NOTE — Anesthesia Procedure Notes (Signed)
Procedure Name: Intubation Date/Time: 04/10/2022 8:02 AM  Performed by: William Laske, Forest Gleason, CRNAPre-anesthesia Checklist: Patient identified, Emergency Drugs available, Suction available, Patient being monitored and Timeout performed Patient Re-evaluated:Patient Re-evaluated prior to induction Oxygen Delivery Method: Circle system utilized Preoxygenation: Pre-oxygenation with 100% oxygen Induction Type: IV induction Ventilation: Mask ventilation without difficulty Grade View: Grade I Tube type: Oral Tube size: 7.0 mm Number of attempts: 1 Airway Equipment and Method: Stylet Secured at: 21 cm Tube secured with: Tape Dental Injury: Teeth and Oropharynx as per pre-operative assessment  Comments: Pt c/o pain when incision made.   Converted to [place] - attempted #4 LMA first - placed without difficulty or trauma, but difficulty seating, possibly d/t pt still being light despite 150, then 50 more propofol given, and pt edentulous.  Changed to ETT, placed without difficulty or trauma

## 2022-04-10 NOTE — Discharge Instructions (Signed)

## 2022-04-10 NOTE — Anesthesia Postprocedure Evaluation (Signed)
Anesthesia Post Note  Patient: Amy Guzman [facility]  Procedure(s) Performed: TOTAL KNEE ARTHROPLASTY (Right: Knee)     Patient location during evaluation: PACU Anesthesia Type: Regional and General Level of consciousness: awake Pain management: pain level controlled Vital Signs Assessment: post-procedure vital signs reviewed and stable Respiratory status: spontaneous breathing, nonlabored ventilation, respiratory function stable and patient connected to nasal cannula oxygen Cardiovascular status: blood pressure returned to baseline and stable Postop Assessment: no apparent nausea or vomiting Anesthetic complications: no   No notable events documented.  Last Vitals:  Vitals:   04/10/22 1728 04/10/22 1846  BP: 129/75 132/83  Pulse: 64 73  Resp: 16 18  Temp: 36.6 C 36.6 C  SpO2: 95% 96%    Last Pain:  Vitals:   04/10/22 1846  TempSrc: Oral  PainSc:                  Hawa Henly P Toula Miyasaki

## 2022-04-10 NOTE — Anesthesia Procedure Notes (Signed)
Spinal  Patient location during procedure: OR Start time: 04/10/2022 7:25 AM End time: 04/10/2022 7:30 AM Reason for block: surgical anesthesia Staffing Performed: anesthesiologist  Anesthesiologist: Murvin Natal, MD Performed by: Murvin Natal, MD Authorized by: Murvin Natal, MD   Preanesthetic Checklist Completed: patient identified, IV checked, risks and benefits discussed, surgical consent, monitors and equipment checked, pre-op evaluation and timeout performed Spinal Block Patient position: sitting Prep: DuraPrep Patient monitoring: cardiac monitor, continuous pulse ox and blood pressure Approach: left paramedian Location: L4-5 Injection technique: single-shot Needle Needle type: Pencan  Needle gauge: 24 G Needle length: 9 cm Assessment Sensory level: T10 Events: CSF return Additional Notes Functioning IV was confirmed and monitors were applied. Sterile prep and drape, including hand hygiene and sterile gloves were used. The patient was positioned and the spine was prepped. The skin was anesthetized with lidocaine.  Free flow of clear CSF was obtained prior to injecting local anesthetic into the CSF.  The spinal needle aspirated freely following injection.  The needle was carefully withdrawn.  The patient tolerated the procedure well.

## 2022-04-10 NOTE — Plan of Care (Signed)
  Problem: Education: Goal: Knowledge of General Education information will improve Description: Including pain rating scale, medication(s)/side effects and non-pharmacologic comfort measures Outcome: Progressing   Problem: Clinical Measurements: Goal: Respiratory complications will improve Outcome: Not Applicable Goal: Cardiovascular complication will be avoided Outcome: Not Applicable   Problem: Nutrition: Goal: Adequate nutrition will be maintained Outcome: Progressing   Problem: Pain Managment: Goal: General experience of comfort will improve Outcome: Progressing

## 2022-04-10 NOTE — Anesthesia Procedure Notes (Signed)
Anesthesia Regional Block: Adductor canal block   Pre-Anesthetic Checklist: , timeout performed,  Correct Patient, Correct Site, Correct Laterality,  Correct Procedure,, site marked,  Risks and benefits discussed,  Surgical consent,  Pre-op evaluation,  At surgeon's request and post-op pain management  Laterality: Right  Prep: chloraprep       Needles:  Injection technique: Single-shot  Needle Type: Echogenic Stimulator Needle     Needle Length: 10cm  Needle Gauge: 20     Additional Needles:   Procedures:,,,, ultrasound used (permanent image in chart),,    Narrative:  Start time: 04/10/2022 7:00 AM End time: 04/10/2022 7:10 AM Injection made incrementally with aspirations every 5 mL.  Performed by: Personally  Anesthesiologist: Murvin Natal, MD  Additional Notes: Functioning IV was confirmed and monitors were applied. A time-out was performed. Hand hygiene and sterile gloves were used. The thigh was placed in a frog-leg position and prepped in a sterile fashion. A 16m 20ga Bbraun echogenic stimulator needle was placed using ultrasound guidance.  Negative aspiration and negative test dose prior to incremental administration of local anesthetic. The patient tolerated the procedure well.

## 2022-04-10 NOTE — Op Note (Addendum)
DATE OF SURGERY:  04/10/2022 TIME: 9:15 AM  PATIENT NAME:  Amy Guzman   AGE: 66 y.o.    PRE-OPERATIVE DIAGNOSIS: End-stage right knee osteoarthritis  POST-OPERATIVE DIAGNOSIS:  Same  PROCEDURE:  Right Total Knee Arthroplasty  SURGEON:  Winferd Wease A Casy Brunetto, MD   ASSISTANT: Izola Price, RNFA, present and scrubbed throughout the case, critical for assistance with exposure, retraction, instrumentation, and closure.   OPERATIVE IMPLANTS:  Cemented Zimmer persona femur size 6 narrow, D tibia, 32 mm patella, 12 MC polyethylene liner Implant Name Type Inv. Item Serial No. Manufacturer Lot No. LRB No. Used Action  CEMENT BONE R 1X40 - [id number] Cement CEMENT BONE R 1X40  ZIMMER [employer]ON(ORTH,TRAU,BIO,SG) [id number] Right 1 Implanted  CEMENT BONE R 1X40 - [id number] Cement CEMENT BONE R 1X40  ZIMMER [employer]ON(ORTH,TRAU,BIO,SG) [id number] Right 1 Implanted  TIBIA STEM 5 DEG SZ D R KNEE - [id number] Knees TIBIA STEM 5 DEG SZ D R KNEE  ZIMMER RECON(ORTH,TRAU,BIO,SG) [id number] Right 1 Implanted  STEM POLY PAT PLY 21M KNEE - [id number] Knees STEM POLY PAT PLY 21M KNEE  ZIMMER RECON(ORTH,TRAU,BIO,SG) [id number] Right 1 Implanted  COMP FEM CEMT PERS NARROW 6 RT - [id number] Joint COMP FEM CEMT PERS NARROW 6 RT  ZIMMER RECON(ORTH,TRAU,BIO,SG) [id number] Right 1 Implanted  INSERT TIB ASF VIV SZ 6-7 12H - [id number] Insert INSERT TIB ASF VIV SZ 6-7 12H  ZIMMER RECON(ORTH,TRAU,BIO,SG) [id number] Right 1 Implanted      PREOPERATIVE INDICATIONS: Shadiyah Wernli is a 66 y.o. year old female with end stage bone on bone degenerative arthritis of the knee who failed conservative treatment, including injections, antiinflammatories, activity modification, and assistive devices, and had significant impairment of their activities of daily living, and elected for Total Knee Arthroplasty.   The risks, benefits, and alternatives were discussed at length including but not limited to the risks of infection, bleeding, nerve injury,  stiffness, blood clots, the need for revision surgery, cardiopulmonary complications, among others, and they were willing to proceed.  OPERATIVE FINDINGS AND UNIQUE ASPECTS OF THE CASE: Valgus deformity, preoperative stiffness range of motion 5 to 100 degrees  ESTIMATED BLOOD LOSS: 25cc  OPERATIVE DESCRIPTION:   Once adequate anesthesia, preoperative antibiotics, 2 gm of ancef,1 gm of Tranexamic Acid, and 8 mg of Decadron administered, the patient was positioned supine with a right thigh tourniquet placed.  The right lower extremity was prepped and draped in sterile fashion.  A time-  out was performed identifying the patient, planned procedure, and the appropriate extremity.     The leg was  exsanguinated, tourniquet elevated to 250 mmHg.  A midline incision was   made followed by median parapatellar arthrotomy. Anterior horn of the medial meniscus was released and resected. A medial release was performed, the infrapatellar fat pad was resected with care taken to protect the patellar tendon. The suprapatellar fat was removed to exposed the distal anterior femur. The anterior horn of the lateral meniscus and ACL were released.    Following initial  exposure, attention was first to the femur.  The femoral   canal was opened with a drill, canal was suctioned to try to prevent fat emboli.  An   intramedullary rod was passed set at 5 degrees valgus, 10 mm. The distal femur was resected.  Following this resection, the tibia was   subluxated anteriorly.  Using the extramedullary guide, 2 mm of bone was resected off   the proximal medial tibia at the lowest point.  Following this resection and replaced 10 mm spacer block.  Had a nice rectangular space in extension but still too tight.  First took 2 mm of the proximal tibia.  Still relatively narrow so take an additional 2 mm of the distal femur..  We confirmed the gap would be   stable medially and laterally with a size 18m spacer block as well as  confirmed that the tibial cut was perpendicular in the coronal plane, checking with an alignment rod.    Once this was done, the posterior femoral referencing femoral sizer was placed under to the posterior condyles with 5 degrees of external rotational which was parallel to the transepicondylar axis and perpendicular to [employer] The femur was sized to be a size 6 in the anterior-  posterior dimension. The   anterior, posterior, and  chamfer cuts were made without difficulty nor   notching making certain that I was along the anterior cortex to help   with flexion gap stability.  Next a laminar spreader was placed with the knee in flexion and the medial lateral menisci were resected.  5 cc of the Exparel mixture was injected in the medial side of the back of the knee and 3 cc in the lateral side.  1/2 inch curved osteotome was used to resect posterior osteophyte that was then removed with a pituitary rongeur.       At this point, the tibia was sized to be a size D.  The size D tray was   then pinned in position. Trial reduction was now carried with a 6 femur,  D tibia, a 10 mm MC insert.  The IT band was partially released laterally to help balance the knee in extension.  A 11 mm insert was placed at this point.  The knee had full extension and was stable to varus valgus stress in extension.  The knee was slightly tight in flexion and the PCL was partially released.   Attention was next directed to the patella.  Precut  measurement was noted to be 19 mm.  I resected down to 13 mm and used a  334mpatellar button to restore patellar height as well as cover the cut surface.     The patella lug holes were drilled and a 32 mm patella poly trial was placed.    The knee was brought to full extension with good flexion stability with the patella   tracking through the trochlea without application of pressure.     Next the femoral component was again assessed and determined to be seated and  appropriately lateralized.  The femoral lug holes were drilled.  The femoral component was then removed.Tibial component was again assessed and felt to be seated and appropriately rotated with the medial third of the tubercle. The tibia was then drilled, and keel punched.     Final components were  opened and cement was mixed.      Final implants were then  cemented onto cleaned and dried cut surfaces of bone with the knee brought to extension with a 12 mm MC poly.  The knee was irrigated with sterile Betadine diluted in saline as well as pulse lavage normal saline.  The synovial lining was  then injected a dilute Exparel.      Once the cement had fully cured, excess cement was removed   throughout the knee.  I confirmed that I was satisfied with the range of   motion and stability, and the final 12 mm MC poly insert was chosen.  It was  placed into the knee.         The tourniquet had been let down.  No significant   hemostasis was required.  The medial parapatellar arthrotomy was then reapproximated using #1 Stratafix sutures with the knee  in flexion.  The   remaining wound was closed with 0 stratafix, 2-0 Vicryl, and running 3-0 Monocryl.   The knee was cleaned, dried, dressed sterilely using Dermabond and   Aquacel dressing.  The patient was then  brought to recovery room in stable condition, tolerating the procedure  well. There were no complications.   Post op recs: WB: WBAT Abx: ancef periop Imaging: PACU xrays DVT prophylaxis: Aspirin '81mg'$  BID x4 weeks Follow up: 2 weeks after surgery for a wound check with Dr. Zachery Dakins at [facility].  Address: [address], [place], [place] [address]  Office Phone: [phone]  Charlies Constable, MD Orthopaedic Surgery

## 2022-04-11 ENCOUNTER — Encounter (HOSPITAL_COMMUNITY): Payer: Self-pay | Admitting: Orthopedic Surgery

## 2022-04-11 DIAGNOSIS — M1711 Unilateral primary osteoarthritis, right knee: Secondary | ICD-10-CM | POA: Diagnosis not present

## 2022-04-11 DIAGNOSIS — E669 Obesity, unspecified: Secondary | ICD-10-CM | POA: Diagnosis not present

## 2022-04-11 DIAGNOSIS — Z6837 Body mass index (BMI) 37.0-37.9, adult: Secondary | ICD-10-CM | POA: Diagnosis not present

## 2022-04-11 DIAGNOSIS — R21 Rash and other nonspecific skin eruption: Secondary | ICD-10-CM | POA: Diagnosis not present

## 2022-04-11 DIAGNOSIS — M25561 Pain in right knee: Secondary | ICD-10-CM | POA: Diagnosis not present

## 2022-04-11 DIAGNOSIS — Z853 Personal history of malignant neoplasm of breast: Secondary | ICD-10-CM | POA: Diagnosis not present

## 2022-04-11 DIAGNOSIS — M25461 Effusion, right knee: Secondary | ICD-10-CM | POA: Diagnosis not present

## 2022-04-11 DIAGNOSIS — Z79899 Other long term (current) drug therapy: Secondary | ICD-10-CM | POA: Diagnosis not present

## 2022-04-11 DIAGNOSIS — I10 Essential (primary) hypertension: Secondary | ICD-10-CM | POA: Diagnosis not present

## 2022-04-11 LAB — CBC
HCT: 34.3 % — ABNORMAL LOW (ref 36.0–46.0)
Hemoglobin: 11.3 g/dL — ABNORMAL LOW (ref 12.0–15.0)
MCH: 31.9 pg (ref 26.0–34.0)
MCHC: 32.9 g/dL (ref 30.0–36.0)
MCV: 96.9 fL (ref 80.0–100.0)
Platelets: 179 K/uL (ref 150–400)
RBC: 3.54 MIL/uL — ABNORMAL LOW (ref 3.87–5.11)
RDW: 12.8 % (ref 11.5–15.5)
WBC: 12.2 K/uL — ABNORMAL HIGH (ref 4.0–10.5)
nRBC: 0 % (ref 0.0–0.2)

## 2022-04-11 LAB — BASIC METABOLIC PANEL WITH GFR
Anion gap: 9 (ref 5–15)
BUN: 15 mg/dL (ref 8–23)
CO2: 27 mmol/L (ref 22–32)
Calcium: 8.5 mg/dL — ABNORMAL LOW (ref 8.9–10.3)
Chloride: 102 mmol/L (ref 98–111)
Creatinine, Ser: 0.67 mg/dL (ref 0.44–1.00)
GFR, Estimated: >60 mL/min (ref >60)
Glucose, Bld: 126 mg/dL — ABNORMAL HIGH (ref 70–99)
Potassium: 3.9 mmol/L (ref 3.5–5.1)
Sodium: 138 mmol/L (ref 135–145)

## 2022-04-11 NOTE — Progress Notes (Signed)
Physical Therapy Treatment  PT comments Pt seen for second visit POD1.  Pt received up in recliner, no complaints, agreeable to participate. Pt min assist for steadying of RW during transfer, min guard during step pivot transfer, min guard for bed mobility. Provided HEP and pt completed with verbal cues, safe technique, all questions answered. Demonstrated car transfer including use of gait belt to provide assistance to RLE, pt verbalized understanding. All education completed and pt's questions answered. Pt has met mobility goals for safe discharge home, PT is signing off. If needs change, please reconsult, thank you for this referral.    04/11/22 1137  PT Visit Information  Last PT Received On 04/11/22  Assistance Needed +1  History of Present Illness Pt is a 66yo female presenting s/p R-TKA on 04/10/22. PMH: GERD, hx of breast cancer, OA, HLD, HTN, PONV.  Subjective Data  Subjective I feel good  Patient Stated Goal To walk without pain  Precautions  Precautions None  Restrictions  Weight Bearing Restrictions No  Pain Assessment  Pain Assessment 0-10  Pain Score 1  Pain Location right knee  Pain Descriptors / Indicators Operative site guarding;Cramping;Discomfort  Pain Intervention(s) Limited activity within patient's tolerance;Monitored during session;Repositioned;Ice applied  Cognition  Arousal/Alertness Awake/alert  Behavior During Therapy WFL for tasks assessed/performed  Overall Cognitive Status Within Functional Limits for tasks assessed  Bed Mobility  Overal bed mobility Needs Assistance  Bed Mobility Supine to Sit;Sit to Supine  Sit to supine Min guard  General bed mobility comments Min guard for safety  Transfers  Overall transfer level Needs assistance  Equipment used Rolling walker (2 wheels)  Transfers Sit to/from Stand;Bed to chair/wheelchair/BSC  Sit to Stand Min assist  Bed to/from chair/wheelchair/BSC transfer type: Step pivot  Step pivot transfers Supervision   General transfer comment Min assist for steadying of RW, multimodal cuing for powering up. Min guard for step pivot transfer for safety.  Balance  Overall balance assessment Needs assistance  Sitting-balance support Feet supported;No upper extremity supported  Sitting balance-Leahy Scale Good  Standing balance support Reliant on assistive device for balance;During functional activity;Bilateral upper extremity supported  Standing balance-Leahy Scale Poor  General Comments  General comments (skin integrity, edema, etc.) Pt on RA throughout session, SpO2 at 95%  Exercises  Exercises Total Joint;Other exercises  Total Joint Exercises  Ankle Circles/Pumps AROM;Both;10 reps;Seated  Quad Sets AROM;Right;10 reps;Supine  Short Arc Quad AROM;10 reps;Supine;Right  Heel Slides AROM;Right;10 reps;Supine  Straight Leg Raises AROM;Right;10 reps;Supine  Hip ABduction/ADduction AROM;Right;10 reps;Supine  PT - End of Session  Equipment Utilized During Treatment Gait belt  Activity Tolerance Patient tolerated treatment well;No increased pain  Patient left with call bell/phone within reach;in chair;with chair alarm set  Nurse Communication Mobility status   PT - Assessment/Plan  PT Plan Current plan remains appropriate  PT Visit Diagnosis Difficulty in walking, not elsewhere classified (R26.2);Pain  Pain - Right/Left Right  Pain - part of body Knee  PT Frequency (ACUTE ONLY) 7X/week  Follow Up Recommendations Follow physician's recommendations for discharge plan and follow up therapies  Assistance recommended at discharge Intermittent Supervision/Assistance  Patient can return home with the following A little help with walking and/or transfers;A little help with bathing/dressing/bathroom;Assistance with cooking/housework;Assist for transportation;Help with stairs or ramp for entrance  PT equipment None recommended by PT  AM-PAC PT "6 Clicks" Mobility Outcome Measure (Version 2)  Help needed turning  from your back to your side while in a flat bed without using bedrails? 4  Help  needed moving from lying on your back to sitting on the side of a flat bed without using bedrails? 4  Help needed moving to and from a bed to a chair (including a wheelchair)? 3  Help needed standing up from a chair using your arms (e.g., wheelchair or bedside chair)? 3  Help needed to walk in hospital room? 3  Help needed climbing 3-5 steps with a railing?  3  6 Click Score 20  Consider Recommendation of Discharge To: Home with no services  Progressive Mobility  What is the highest level of mobility based on the progressive mobility assessment? Level 5 (Walks with assist in room/hall) - Balance while stepping forward/back and can walk in room with assist - Complete  Activity Ambulated with assistance in room  PT Goal Progression  Progress towards PT goals Progressing toward goals  Acute Rehab PT Goals  PT Goal Formulation With patient  Time For Goal Achievement 04/17/22  Potential to Achieve Goals Good  PT Time Calculation  PT Start Time (ACUTE ONLY) 1125  PT Stop Time (ACUTE ONLY) 1155  PT Time Calculation (min) (ACUTE ONLY) 30 min  PT General Charges  $$ ACUTE PT VISIT 1 Visit  PT Treatments  $Therapeutic Exercise 23-37 mins   Coolidge Breeze, PT, DPT [facility] Department Office: [phone] Pager: [phone]

## 2022-04-11 NOTE — Progress Notes (Signed)
Physical Therapy Treatment Patient Details Name: Amy Guzman MRN: [id number] DOB: [birth year] Today's Date: 04/11/2022   History of Present Illness Pt is a 66yo female presenting s/p R-TKA on 04/10/22. PMH: GERD, hx of breast cancer, OA, HLD, HTN, PONV.    PT Comments    Pt seen for first of two sessions POD1.  Pt supine in bed agreeable to be seen, BP taken at start and end of session, soft at beginning and normal at end, pt asymptomatic throughout session. Pt required min assist to sit EOB, min guard for transfers with multimodal cuing, and min guard for 24f ambulation in hallway with RW. Pt in recliner at exit and instructed on exercises, provided education on icing R knee at home, pt verbalized understanding. We will continue to follow the pt acutely to promote safe discharge home.   Recommendations for follow up therapy are one component of a multi-disciplinary discharge planning process, led by the attending physician.  Recommendations may be updated based on patient status, additional functional criteria and insurance authorization.  Follow Up Recommendations  Follow physician's recommendations for discharge plan and follow up therapies     Assistance Recommended at Discharge Intermittent Supervision/Assistance  Patient can return home with the following A little help with walking and/or transfers;A little help with bathing/dressing/bathroom;Assistance with cooking/housework;Assist for transportation;Help with stairs or ramp for entrance   Equipment Recommendations  None recommended by PT    Recommendations for Other Services       Precautions / Restrictions Precautions Precautions: None Restrictions Weight Bearing Restrictions: No     Mobility  Bed Mobility Overal bed mobility: Needs Assistance Bed Mobility: Supine to Sit, Sit to Supine     Supine to sit: Min assist     General bed mobility comments: Pt assisted to sit via trunk elevation and scooted via bed pad  to EOB, min assist.    Transfers Overall transfer level: Needs assistance Equipment used: Rolling walker (2 wheels) Transfers: Sit to/from Stand Sit to Stand: Min guard, From elevated surface           General transfer comment: Min guard for safety from elevated surface, no physical assist required, multimodal cues for sequencing. Pt reporting "my left leg is also bad, my knee hurts and I can't move my ankle." Pt reports L ankle is fused, confirmed via passive movement.    Ambulation/Gait Ambulation/Gait assistance: Min guard, +2 safety/equipment Gait Distance (Feet): 40 Feet Assistive device: Rolling walker (2 wheels) Gait Pattern/deviations: Step-to pattern, Knee hyperextension - left, Decreased weight shift to right Gait velocity: decreased     General Gait Details: Pt ambulated 471fwith RW and min guard, no physical assist required, +2 for recliner follow for safety, no overt LOB noted. Pt with extended LLE, reduced DF on L, and at times stepping on forefoot without heel down on floor.   Stairs             Wheelchair Mobility    Modified Rankin (Stroke Patients Only)       Balance Overall balance assessment: Needs assistance Sitting-balance support: Feet supported, No upper extremity supported Sitting balance-Leahy Scale: Good     Standing balance support: Reliant on assistive device for balance, During functional activity, Bilateral upper extremity supported Standing balance-Leahy Scale: Poor                              Cognition Arousal/Alertness: Awake/alert Behavior During Therapy: WFL for tasks assessed/performed Overall  Cognitive Status: Within Functional Limits for tasks assessed                                          Exercises Total Joint Exercises Ankle Circles/Pumps: AROM, Both, 10 reps, Seated Other Exercises Other Exercises: Incentive spirometry x5, VCs for slow and controlled    General Comments         Pertinent Vitals/Pain Pain Assessment Pain Assessment: No/denies pain    Home Living                          Prior Function            PT Goals (current goals can now be found in the care plan section) Acute Rehab PT Goals Patient Stated Goal: To walk without pain PT Goal Formulation: With patient Time For Goal Achievement: 04/17/22 Potential to Achieve Goals: Good Progress towards PT goals: Progressing toward goals    Frequency    7X/week      PT Plan Current plan remains appropriate    Co-evaluation              AM-PAC PT "6 Clicks" Mobility   Outcome Measure  Help needed turning from your back to your side while in a flat bed without using bedrails?: None Help needed moving from lying on your back to sitting on the side of a flat bed without using bedrails?: A Little Help needed moving to and from a bed to a chair (including a wheelchair)?: A Little Help needed standing up from a chair using your arms (e.g., wheelchair or bedside chair)?: A Little Help needed to walk in hospital room?: A Little Help needed climbing 3-5 steps with a railing? : A Lot 6 Click Score: 18    End of Session Equipment Utilized During Treatment: Gait belt Activity Tolerance: Patient tolerated treatment well;No increased pain Patient left: with call bell/phone within reach;in chair;with chair alarm set Nurse Communication: Mobility status PT Visit Diagnosis: Difficulty in walking, not elsewhere classified (R26.2);Pain Pain - Right/Left: Right Pain - part of body: Knee     Time: 2778-2423 PT Time Calculation (min) (ACUTE ONLY): 23 min  Charges:  $Gait Training: 23-37 mins                     Coolidge Breeze, PT, DPT [facility] Department Office: [phone] Pager: [phone]   Coolidge Breeze 04/11/2022, 10:40 AM

## 2022-04-11 NOTE — Progress Notes (Signed)
     Subjective:  Patient reports pain as mild.  Was very drowsy yesterday and unable to participate with PT. She had to get GA intraop as spinal did not seem to be working. More alert this morning. Eager to work with PT. Deneis dsital n/t.   Objective:   VITALS:   Vitals:   04/10/22 1846 04/10/22 2118 04/11/22 0149 04/11/22 0555  BP: 132/83 122/68 112/72 127/73  Pulse: 73 75 63 71  Resp: '18 18 18 18  '$ Temp: 97.8 F (36.6 C) 98.2 F (36.8 C) 97.6 F (36.4 C) 97.7 F (36.5 C)  TempSrc: Oral Oral Oral Oral  SpO2: 96% 95% 99% 100%  Weight:      Height:        Sensation intact distally Intact pulses distally Dorsiflexion/Plantar flexion intact Incision: dressing C/D/I Compartment soft   Lab Results  Component Value Date   WBC 12.2 (H) 04/11/2022   HGB 11.3 (L) 04/11/2022   HCT 34.3 (L) 04/11/2022   MCV 96.9 04/11/2022   PLT 179 04/11/2022   BMET    Component Value Date/Time   NA 138 04/11/2022 0344   K 3.9 04/11/2022 0344   CL 102 04/11/2022 0344   CO2 27 04/11/2022 0344   GLUCOSE 126 (H) 04/11/2022 0344   BUN 15 04/11/2022 0344   CREATININE 0.67 04/11/2022 0344   CALCIUM 8.5 (L) 04/11/2022 0344   GFRNONAA >60 04/11/2022 0344      Xray: post op xrays demonstrate TKA components in good position without adverse features  Assessment/Plan: 1 Day Post-Op   Principal Problem:   S/P TKR (total knee replacement), right  S/p R TKA 6/19  Post op recs: WB: WBAT Abx: ancef periop Imaging: PACU xrays DVT prophylaxis: Aspirin '81mg'$  BID x4 weeks Follow up: 2 weeks after surgery for a wound check with Dr. Zachery Dakins at [facility].  Address: [address] [place], [place], [place] [address]  Office Phone: [phone]    Willaim Sheng 04/11/2022, 6:19 AM   Charlies Constable, MD  Contact information:   [phone] 7am-5pm epic message Dr. Zachery Dakins, or call office for patient follow up: [phone] After hours and holidays please  check [email or web address] for group call information for [facility]

## 2022-04-11 NOTE — TOC Transition Note (Signed)
Transition of Care [facility]) - CM/SW Discharge Note   Patient Details  Name: Amy Guzman MRN: [id number] Date of Birth: 1955-12-05  Transition of [facility] CM/SW Contact:  Ross Ludwig, LCSW Phone Number: 04/11/2022, 12:39 PM   Clinical Narrative:     Patient does not need any DME.  Patient was prearranged with [place] for home health PT services.  CSW signing off, please reconsult if social work needs arise.   Final next level of care: [place] Barriers to Discharge: Barriers Resolved   Patient Goals and CMS Choice Patient states their goals for this hospitalization and ongoing recovery are:: To return back home with home health.      Discharge Placement                       Discharge Plan and Services                          HH Arranged: PT HH Agency: [place] Date [facility] Agency Contacted: 04/10/22 Time HH Agency Contacted: [phone]    Social Determinants of Health (SDOH) Interventions     Readmission Risk Interventions     No data to display

## 2022-04-11 NOTE — Progress Notes (Signed)
Discharge instructions reviewed with patient. All questions and concerns answered. All belongings given to patient. DME sent home with patient. IV removed per protocol, tolerated well. Intact. Denies any current complaints of pain.

## 2022-04-12 NOTE — Discharge Summary (Addendum)
Physician Discharge Summary  Patient ID: Amy Guzman MRN: [id number] DOB/AGE: 24-Apr-1956 66 y.o.  Admit date: 04/10/2022 Discharge date: 04/11/22  Admission Diagnoses:  S/P TKR (total knee replacement), right  Discharge Diagnoses:  Principal Problem:   S/P TKR (total knee replacement), right   Past Medical History:  Diagnosis Date   Anxiety    Arthritis    Blood transfusion without reported diagnosis    breast cancer    Breast Cancer 1995 and 2005   GERD (gastroesophageal reflux disease)    Hyperlipidemia    Hypertension    Pneumonia    as a child   PONV (postoperative nausea and vomiting)     Surgeries: Procedure(s): TOTAL KNEE ARTHROPLASTY on 04/10/2022   Consultants (if any):   Discharged Condition: Improved  Hospital Course: Amy Guzman is an 66 y.o. female who was admitted 04/10/2022 with a diagnosis of S/P TKR (total knee replacement), right and went to the operating room on 04/10/2022 and underwent the above named procedures.    She was given perioperative antibiotics:  Anti-infectives (From admission, onward)    Start     Dose/Rate Route Frequency Ordered Stop   04/10/22 1405  ceFAZolin (ANCEF) 2-4 GM/100ML-% IVPB       Note to Pharmacy: Marylyn Ishihara: cabinet override      04/10/22 1405 04/10/22 1931   04/10/22 1330  ceFAZolin (ANCEF) IVPB 2g/100 mL premix        2 g 200 mL/hr over 30 Minutes Intravenous Every 6 hours 04/10/22 0939 04/10/22 2145   04/10/22 0600  ceFAZolin (ANCEF) IVPB 2g/100 mL premix        2 g 200 mL/hr over 30 Minutes Intravenous On call to O.R. 04/10/22 4081 04/10/22 0732     .  She was given sequential compression devices, early ambulation, and aspirin for DVT prophylaxis.  She benefited maximally from the hospital stay and there were no complications.    Recent vital signs:  Vitals:   04/11/22 1007 04/11/22 1025  BP: 111/69 128/73  Pulse:    Resp:    Temp:    SpO2: 100% 100%    Recent laboratory  studies:  Lab Results  Component Value Date   HGB 11.3 (L) 04/11/2022   HGB 13.7 03/31/2022   HGB 11.9 06/28/2006   Lab Results  Component Value Date   WBC 12.2 (H) 04/11/2022   PLT 179 04/11/2022   No results found for: "INR" Lab Results  Component Value Date   NA 138 04/11/2022   K 3.9 04/11/2022   CL 102 04/11/2022   CO2 27 04/11/2022   BUN 15 04/11/2022   CREATININE 0.67 04/11/2022   GLUCOSE 126 (H) 04/11/2022    Discharge Medications:   Allergies as of 04/11/2022   No Known Allergies      Medication List     TAKE these medications    acetaminophen 500 MG tablet Commonly known as: TYLENOL Take 2 tablets (1,000 mg total) by mouth every 8 (eight) hours as needed.   allopurinol 100 MG tablet Commonly known as: ZYLOPRIM Take 100 mg by mouth daily.   aspirin EC 81 MG tablet Take 1 tablet (81 mg total) by mouth 2 (two) times daily for 28 days. Swallow whole.   etodolac 400 MG tablet Commonly known as: LODINE Take 400 mg by mouth 2 (two) times daily.   HYDROcodone-acetaminophen 5-325 MG tablet Commonly known as: NORCO/VICODIN Take 1-2 tablets by mouth every 6 (six) hours as needed.   methocarbamol  500 MG tablet Commonly known as: ROBAXIN Take 1 tablet (500 mg total) by mouth every 8 (eight) hours as needed for up to 10 days for muscle spasms.   Mitigare 0.6 MG Caps Generic drug: Colchicine Take 0.6 mg by mouth daily.   ondansetron 4 MG tablet Commonly known as: Zofran Take 1 tablet (4 mg total) by mouth every 8 (eight) hours as needed for up to 14 days for nausea or vomiting.   oxyCODONE 5 MG immediate release tablet Commonly known as: Roxicodone Take 1 tablet (5 mg total) by mouth every 4 (four) hours as needed for up to 7 days for severe pain or moderate pain.   rosuvastatin 10 MG tablet Commonly known as: CRESTOR Take 10 mg by mouth daily.   valsartan-hydrochlorothiazide 80-12.5 MG tablet Commonly known as: DIOVAN-HCT Take 1 tablet by mouth  daily.        Diagnostic Studies: DG Knee Right Port  Result Date: 04/10/2022 CLINICAL DATA:  Postop knee replacement. EXAM: PORTABLE RIGHT KNEE - 1-2 VIEW COMPARISON:  11/25/2021. FINDINGS: Knee prosthetic components appear well seated and aligned. No acute fracture or evidence of an operative complication. IMPRESSION: Well-positioned total right knee arthroplasty. Electronically Signed   By: Lajean Manes M.D.   On: 04/10/2022 10:39    Disposition: Discharge disposition: 01-Home or Self Care       Discharge Instructions     Call MD / Call 911   Complete by: As directed    If you experience chest pain or shortness of breath, CALL 911 and be transported to the hospital emergency room.  If you develope a fever above 101 F, pus (white drainage) or increased drainage or redness at the wound, or calf pain, call your surgeon's office.   Constipation Prevention   Complete by: As directed    Drink plenty of fluids.  Prune juice may be helpful.  You may use a stool softener, such as Colace (over the counter) 100 mg twice a day.  Use MiraLax (over the counter) for constipation as needed.   Diet - low sodium heart healthy   Complete by: As directed    Increase activity slowly as tolerated   Complete by: As directed    Post-operative opioid taper instructions:   Complete by: As directed    POST-OPERATIVE OPIOID TAPER INSTRUCTIONS: It is important to wean off of your opioid medication as soon as possible. If you do not need pain medication after your surgery it is ok to stop day one. Opioids include: Codeine, Hydrocodone(Norco, Vicodin), Oxycodone(Percocet, oxycontin) and hydromorphone amongst others.  Long term and even short term use of opiods can cause: Increased pain response Dependence Constipation Depression Respiratory depression And more.  Withdrawal symptoms can include Flu like symptoms Nausea, vomiting And more Techniques to manage these symptoms Hydrate well Eat  regular healthy meals Stay active Use relaxation techniques(deep breathing, meditating, yoga) Do Not substitute Alcohol to help with tapering If you have been on opioids for less than two weeks and do not have pain than it is ok to stop all together.  Plan to wean off of opioids This plan should start within one week post op of your joint replacement. Maintain the same interval or time between taking each dose and first decrease the dose.  Cut the total daily intake of opioids by one tablet each day Next start to increase the time between doses. The last dose that should be eliminated is the evening dose.  Follow-up Information     Willaim Sheng, MD. Go on 04/24/2022.   Specialty: Orthopedic Surgery Why: Your appointment is at 4:15. Contact information: [address] [place] [address] [phone]         [facility] [place] Follow up.   Specialty: [place] Why: HHPT wil provide 6 home visits prior to starting Outpatient physcial therapy Contact information: [place] [address] [phone]         [facility], [place]. Go on 04/24/2022.   Why: Your outpatient physical therapy is scheduled for 3:00. Please arrive at 2:45 to complete your paperwork Contact information: [facility] [place] [address] [phone]                    Discharge Instructions      INSTRUCTIONS AFTER JOINT REPLACEMENT   Remove items at home which could result in a fall. This includes throw rugs or furniture in walking pathways ICE to the affected joint every three hours while awake for 30 minutes at a time, for at least the first 3-5 days, and then as needed for pain and swelling.  Continue to use ice for pain and swelling. You may notice swelling that will progress down to the foot and ankle.  This is normal after surgery.  Elevate your leg when you  are not up walking on it.   Continue to use the breathing machine you got in the hospital (incentive spirometer) which will help keep your temperature down.  It is common for your temperature to cycle up and down following surgery, especially at night when you are not up moving around and exerting yourself.  The breathing machine keeps your lungs expanded and your temperature down.   DIET:  As you were doing prior to hospitalization, we recommend a well-balanced diet.  DRESSING / WOUND CARE / SHOWERING  Keep the surgical dressing until follow up.  The dressing is water proof, so you can shower without any extra covering.  IF THE DRESSING FALLS OFF or the wound gets wet inside, change the dressing with sterile gauze.  Please use good hand washing techniques before changing the dressing.  Do not use any lotions or creams on the incision until instructed by your surgeon.    ACTIVITY  Increase activity slowly as tolerated, but follow the weight bearing instructions below.   No driving for 6 weeks or until further direction given by your physician.  You cannot drive while taking narcotics.  No lifting or carrying greater than 10 lbs. until further directed by your surgeon. Avoid periods of inactivity such as sitting longer than an hour when not asleep. This helps prevent blood clots.  You may return to work once you are authorized by your doctor.     WEIGHT BEARING   Weight bearing as tolerated with assist device (walker, cane, etc) as directed, use it as long as suggested by your surgeon or therapist, typically at least 4-6 weeks.   EXERCISES  Results after joint replacement surgery are often greatly improved when you follow the exercise, range of motion and muscle strengthening exercises prescribed by your doctor. Safety measures are also important to protect the joint from further injury. Any time any of these exercises cause you to have increased pain or swelling, decrease what you are  doing until you are comfortable again and then slowly increase them. If you have problems or questions,  call your caregiver or physical therapist for advice.   Rehabilitation is important following a joint replacement. After just a few days of immobilization, the muscles of the leg can become weakened and shrink (atrophy).  These exercises are designed to build up the tone and strength of the thigh and leg muscles and to improve motion. Often times heat used for twenty to thirty minutes before working out will loosen up your tissues and help with improving the range of motion but do not use heat for the first two weeks following surgery (sometimes heat can increase post-operative swelling).   These exercises can be done on a training (exercise) mat, on the floor, on a table or on a bed. Use whatever works the best and is most comfortable for you.    Use music or television while you are exercising so that the exercises are a pleasant break in your day. This will make your life better with the exercises acting as a break in your routine that you can look forward to.   Perform all exercises about fifteen times, three times per day or as directed.  You should exercise both the operative leg and the other leg as well.  Exercises include:   Quad Sets - Tighten up the muscle on the front of the thigh (Quad) and hold for 5-10 seconds.   Straight Leg Raises - With your knee straight (if you were given a brace, keep it on), lift the leg to 60 degrees, hold for 3 seconds, and slowly lower the leg.  Perform this exercise against resistance later as your leg gets stronger.  Leg Slides: Lying on your back, slowly slide your foot toward your buttocks, bending your knee up off the floor (only go as far as is comfortable). Then slowly slide your foot back down until your leg is flat on the floor again.  Angel Wings: Lying on your back spread your legs to the side as far apart as you can without causing discomfort.   Hamstring Strength:  Lying on your back, push your heel against the floor with your leg straight by tightening up the muscles of your buttocks.  Repeat, but this time bend your knee to a comfortable angle, and push your heel against the floor.  You may put a pillow under the heel to make it more comfortable if necessary.   A rehabilitation program following joint replacement surgery can speed recovery and prevent re-injury in the future due to weakened muscles. Contact your doctor or a physical therapist for more information on knee rehabilitation.    CONSTIPATION  Constipation is defined medically as fewer than three stools per week and severe constipation as less than one stool per week.  Even if you have a regular bowel pattern at home, your normal regimen is likely to be disrupted due to multiple reasons following surgery.  Combination of anesthesia, postoperative narcotics, change in appetite and fluid intake all can affect your bowels.   YOU MUST use at least one of the following options; they are listed in order of increasing strength to get the job done.  They are all available over the counter, and you may need to use some, POSSIBLY even all of these options:    Drink plenty of fluids (prune juice may be helpful) and high fiber foods Colace 100 mg by mouth twice a day  Senokot for constipation as directed and as needed Dulcolax (bisacodyl), take with full glass of water  Miralax (polyethylene glycol) once or  twice a day as needed.  If you have tried all these things and are unable to have a bowel movement in the first 3-4 days after surgery call either your surgeon or your primary doctor.    If you experience loose stools or diarrhea, hold the medications until you stool forms back up.  If your symptoms do not get better within 1 week or if they get worse, check with your doctor.  If you experience "the worst abdominal pain ever" or develop nausea or vomiting, please contact the office  immediately for further recommendations for treatment.   ITCHING:  If you experience itching with your medications, try taking only a single pain pill, or even half a pain pill at a time.  You can also use Benadryl over the counter for itching or also to help with sleep.   TED HOSE STOCKINGS:  Use stockings on both legs until for at least 2 weeks or as directed by physician office. They may be removed at night for sleeping.  MEDICATIONS:  See your medication summary on the "After Visit Summary" that nursing will review with you.  You may have some home medications which will be placed on hold until you complete the course of blood thinner medication.  It is important for you to complete the blood thinner medication as prescribed.   Blood clot prevention (DVT Prophylaxis): After surgery you are at an increased risk for a blood clot. you were prescribed a blood thinner, Aspirin '81mg'$ , to be taken twice daily for a total of 4 weeks from surgery to help reduce your risk of getting a blood clot. This will help prevent a blood clot. Signs of a pulmonary embolus (blood clot in the lungs) include sudden short of breath, feeling lightheaded or dizzy, chest pain with a deep breath, rapid pulse rapid breathing. Signs of a blood clot in your arms or legs include new unexplained swelling and cramping, warm, red or darkened skin around the painful area. Please call the office or 911 right away if these signs or symptoms develop.  PRECAUTIONS:  If you experience chest pain or shortness of breath - call 911 immediately for transfer to the hospital emergency department.   If you develop a fever greater that 101 F, purulent drainage from wound, increased redness or drainage from wound, foul odor from the wound/dressing, or calf pain - CONTACT YOUR SURGEON.                                                   FOLLOW-UP APPOINTMENTS:  If you do not already have a post-op appointment, please call the office for an  appointment to be seen by your surgeon.  Guidelines for how soon to be seen are listed in your "After Visit Summary", but are typically between 2-3 weeks after surgery.   POST-OPERATIVE OPIOID TAPER INSTRUCTIONS: It is important to wean off of your opioid medication as soon as possible. If you do not need pain medication after your surgery it is ok to stop day one. Opioids include: Codeine, Hydrocodone(Norco, Vicodin), Oxycodone(Percocet, oxycontin) and hydromorphone amongst others.  Long term and even short term use of opiods can cause: Increased pain response Dependence Constipation Depression Respiratory depression And more.  Withdrawal symptoms can include Flu like symptoms Nausea, vomiting And more Techniques to manage these symptoms Hydrate well Eat regular  healthy meals Stay active Use relaxation techniques(deep breathing, meditating, yoga) Do Not substitute Alcohol to help with tapering If you have been on opioids for less than two weeks and do not have pain than it is ok to stop all together.  Plan to wean off of opioids This plan should start within one week post op of your joint replacement. Maintain the same interval or time between taking each dose and first decrease the dose.  Cut the total daily intake of opioids by one tablet each day Next start to increase the time between doses. The last dose that should be eliminated is the evening dose.   MAKE SURE YOU:  Understand these instructions.  Get help right away if you are not doing well or get worse.    Thank you for letting us be a part of your medical care team.  It is a privilege we respect greatly.  We hope these instructions will help you stay on track for a fast and full recovery!            Signed: Kemari Mares A Zeth Buday 04/12/2022, 10:27 AM

## 2022-04-24 DIAGNOSIS — M25661 Stiffness of right knee, not elsewhere classified: Secondary | ICD-10-CM | POA: Diagnosis not present

## 2022-04-24 DIAGNOSIS — M1711 Unilateral primary osteoarthritis, right knee: Secondary | ICD-10-CM | POA: Diagnosis not present

## 2022-04-24 DIAGNOSIS — Z96651 Presence of right artificial knee joint: Secondary | ICD-10-CM | POA: Diagnosis not present

## 2022-04-24 DIAGNOSIS — M6281 Muscle weakness (generalized): Secondary | ICD-10-CM | POA: Diagnosis not present

## 2022-04-24 DIAGNOSIS — R262 Difficulty in walking, not elsewhere classified: Secondary | ICD-10-CM | POA: Diagnosis not present

## 2022-04-27 DIAGNOSIS — M6281 Muscle weakness (generalized): Secondary | ICD-10-CM | POA: Diagnosis not present

## 2022-04-27 DIAGNOSIS — Z96651 Presence of right artificial knee joint: Secondary | ICD-10-CM | POA: Diagnosis not present

## 2022-04-27 DIAGNOSIS — R262 Difficulty in walking, not elsewhere classified: Secondary | ICD-10-CM | POA: Diagnosis not present

## 2022-04-27 DIAGNOSIS — M1711 Unilateral primary osteoarthritis, right knee: Secondary | ICD-10-CM | POA: Diagnosis not present

## 2022-04-27 DIAGNOSIS — M25661 Stiffness of right knee, not elsewhere classified: Secondary | ICD-10-CM | POA: Diagnosis not present

## 2022-05-02 DIAGNOSIS — M6281 Muscle weakness (generalized): Secondary | ICD-10-CM | POA: Diagnosis not present

## 2022-05-02 DIAGNOSIS — M25661 Stiffness of right knee, not elsewhere classified: Secondary | ICD-10-CM | POA: Diagnosis not present

## 2022-05-02 DIAGNOSIS — Z96651 Presence of right artificial knee joint: Secondary | ICD-10-CM | POA: Diagnosis not present

## 2022-05-02 DIAGNOSIS — M1711 Unilateral primary osteoarthritis, right knee: Secondary | ICD-10-CM | POA: Diagnosis not present

## 2022-05-02 DIAGNOSIS — R262 Difficulty in walking, not elsewhere classified: Secondary | ICD-10-CM | POA: Diagnosis not present

## 2022-05-05 DIAGNOSIS — R262 Difficulty in walking, not elsewhere classified: Secondary | ICD-10-CM | POA: Diagnosis not present

## 2022-05-05 DIAGNOSIS — M1711 Unilateral primary osteoarthritis, right knee: Secondary | ICD-10-CM | POA: Diagnosis not present

## 2022-05-05 DIAGNOSIS — M6281 Muscle weakness (generalized): Secondary | ICD-10-CM | POA: Diagnosis not present

## 2022-05-05 DIAGNOSIS — M25661 Stiffness of right knee, not elsewhere classified: Secondary | ICD-10-CM | POA: Diagnosis not present

## 2022-05-05 DIAGNOSIS — Z96651 Presence of right artificial knee joint: Secondary | ICD-10-CM | POA: Diagnosis not present

## 2022-05-12 DIAGNOSIS — R262 Difficulty in walking, not elsewhere classified: Secondary | ICD-10-CM | POA: Diagnosis not present

## 2022-05-12 DIAGNOSIS — Z96651 Presence of right artificial knee joint: Secondary | ICD-10-CM | POA: Diagnosis not present

## 2022-05-12 DIAGNOSIS — M1711 Unilateral primary osteoarthritis, right knee: Secondary | ICD-10-CM | POA: Diagnosis not present

## 2022-05-12 DIAGNOSIS — M6281 Muscle weakness (generalized): Secondary | ICD-10-CM | POA: Diagnosis not present

## 2022-05-12 DIAGNOSIS — M25661 Stiffness of right knee, not elsewhere classified: Secondary | ICD-10-CM | POA: Diagnosis not present

## 2022-05-22 DIAGNOSIS — M1711 Unilateral primary osteoarthritis, right knee: Secondary | ICD-10-CM | POA: Diagnosis not present

## 2022-05-23 DIAGNOSIS — M1711 Unilateral primary osteoarthritis, right knee: Secondary | ICD-10-CM | POA: Diagnosis not present

## 2022-05-23 DIAGNOSIS — M25661 Stiffness of right knee, not elsewhere classified: Secondary | ICD-10-CM | POA: Diagnosis not present

## 2022-05-23 DIAGNOSIS — Z96651 Presence of right artificial knee joint: Secondary | ICD-10-CM | POA: Diagnosis not present

## 2022-05-23 DIAGNOSIS — R262 Difficulty in walking, not elsewhere classified: Secondary | ICD-10-CM | POA: Diagnosis not present

## 2022-05-23 DIAGNOSIS — M6281 Muscle weakness (generalized): Secondary | ICD-10-CM | POA: Diagnosis not present

## 2022-05-25 DIAGNOSIS — M6281 Muscle weakness (generalized): Secondary | ICD-10-CM | POA: Diagnosis not present

## 2022-05-25 DIAGNOSIS — Z96651 Presence of right artificial knee joint: Secondary | ICD-10-CM | POA: Diagnosis not present

## 2022-05-25 DIAGNOSIS — R262 Difficulty in walking, not elsewhere classified: Secondary | ICD-10-CM | POA: Diagnosis not present

## 2022-05-25 DIAGNOSIS — M25661 Stiffness of right knee, not elsewhere classified: Secondary | ICD-10-CM | POA: Diagnosis not present

## 2022-05-25 DIAGNOSIS — M1711 Unilateral primary osteoarthritis, right knee: Secondary | ICD-10-CM | POA: Diagnosis not present

## 2022-05-31 DIAGNOSIS — R262 Difficulty in walking, not elsewhere classified: Secondary | ICD-10-CM | POA: Diagnosis not present

## 2022-05-31 DIAGNOSIS — M6281 Muscle weakness (generalized): Secondary | ICD-10-CM | POA: Diagnosis not present

## 2022-05-31 DIAGNOSIS — M1711 Unilateral primary osteoarthritis, right knee: Secondary | ICD-10-CM | POA: Diagnosis not present

## 2022-05-31 DIAGNOSIS — M25661 Stiffness of right knee, not elsewhere classified: Secondary | ICD-10-CM | POA: Diagnosis not present

## 2022-05-31 DIAGNOSIS — Z96651 Presence of right artificial knee joint: Secondary | ICD-10-CM | POA: Diagnosis not present

## 2022-06-02 DIAGNOSIS — R262 Difficulty in walking, not elsewhere classified: Secondary | ICD-10-CM | POA: Diagnosis not present

## 2022-06-02 DIAGNOSIS — M1711 Unilateral primary osteoarthritis, right knee: Secondary | ICD-10-CM | POA: Diagnosis not present

## 2022-06-02 DIAGNOSIS — M6281 Muscle weakness (generalized): Secondary | ICD-10-CM | POA: Diagnosis not present

## 2022-06-02 DIAGNOSIS — M25661 Stiffness of right knee, not elsewhere classified: Secondary | ICD-10-CM | POA: Diagnosis not present

## 2022-06-02 DIAGNOSIS — Z96651 Presence of right artificial knee joint: Secondary | ICD-10-CM | POA: Diagnosis not present

## 2022-06-06 DIAGNOSIS — Z96651 Presence of right artificial knee joint: Secondary | ICD-10-CM | POA: Diagnosis not present

## 2022-06-06 DIAGNOSIS — M25661 Stiffness of right knee, not elsewhere classified: Secondary | ICD-10-CM | POA: Diagnosis not present

## 2022-06-06 DIAGNOSIS — M6281 Muscle weakness (generalized): Secondary | ICD-10-CM | POA: Diagnosis not present

## 2022-06-06 DIAGNOSIS — R262 Difficulty in walking, not elsewhere classified: Secondary | ICD-10-CM | POA: Diagnosis not present

## 2022-06-06 DIAGNOSIS — M1711 Unilateral primary osteoarthritis, right knee: Secondary | ICD-10-CM | POA: Diagnosis not present

## 2022-06-08 DIAGNOSIS — M25661 Stiffness of right knee, not elsewhere classified: Secondary | ICD-10-CM | POA: Diagnosis not present

## 2022-06-08 DIAGNOSIS — M1711 Unilateral primary osteoarthritis, right knee: Secondary | ICD-10-CM | POA: Diagnosis not present

## 2022-06-08 DIAGNOSIS — M6281 Muscle weakness (generalized): Secondary | ICD-10-CM | POA: Diagnosis not present

## 2022-06-08 DIAGNOSIS — R262 Difficulty in walking, not elsewhere classified: Secondary | ICD-10-CM | POA: Diagnosis not present

## 2022-06-08 DIAGNOSIS — Z96651 Presence of right artificial knee joint: Secondary | ICD-10-CM | POA: Diagnosis not present

## 2022-06-13 DIAGNOSIS — M1711 Unilateral primary osteoarthritis, right knee: Secondary | ICD-10-CM | POA: Diagnosis not present

## 2022-06-14 DIAGNOSIS — M6281 Muscle weakness (generalized): Secondary | ICD-10-CM | POA: Diagnosis not present

## 2022-06-14 DIAGNOSIS — M1711 Unilateral primary osteoarthritis, right knee: Secondary | ICD-10-CM | POA: Diagnosis not present

## 2022-06-14 DIAGNOSIS — M25661 Stiffness of right knee, not elsewhere classified: Secondary | ICD-10-CM | POA: Diagnosis not present

## 2022-06-14 DIAGNOSIS — Z96651 Presence of right artificial knee joint: Secondary | ICD-10-CM | POA: Diagnosis not present

## 2022-06-14 DIAGNOSIS — R262 Difficulty in walking, not elsewhere classified: Secondary | ICD-10-CM | POA: Diagnosis not present

## 2022-06-16 DIAGNOSIS — M6281 Muscle weakness (generalized): Secondary | ICD-10-CM | POA: Diagnosis not present

## 2022-06-16 DIAGNOSIS — M25661 Stiffness of right knee, not elsewhere classified: Secondary | ICD-10-CM | POA: Diagnosis not present

## 2022-06-16 DIAGNOSIS — R262 Difficulty in walking, not elsewhere classified: Secondary | ICD-10-CM | POA: Diagnosis not present

## 2022-06-16 DIAGNOSIS — Z96651 Presence of right artificial knee joint: Secondary | ICD-10-CM | POA: Diagnosis not present

## 2022-06-16 DIAGNOSIS — M1711 Unilateral primary osteoarthritis, right knee: Secondary | ICD-10-CM | POA: Diagnosis not present

## 2022-06-20 DIAGNOSIS — M25661 Stiffness of right knee, not elsewhere classified: Secondary | ICD-10-CM | POA: Diagnosis not present

## 2022-06-20 DIAGNOSIS — Z96651 Presence of right artificial knee joint: Secondary | ICD-10-CM | POA: Diagnosis not present

## 2022-06-20 DIAGNOSIS — M6281 Muscle weakness (generalized): Secondary | ICD-10-CM | POA: Diagnosis not present

## 2022-06-20 DIAGNOSIS — R262 Difficulty in walking, not elsewhere classified: Secondary | ICD-10-CM | POA: Diagnosis not present

## 2022-06-20 DIAGNOSIS — M1711 Unilateral primary osteoarthritis, right knee: Secondary | ICD-10-CM | POA: Diagnosis not present

## 2022-06-22 DIAGNOSIS — R262 Difficulty in walking, not elsewhere classified: Secondary | ICD-10-CM | POA: Diagnosis not present

## 2022-06-22 DIAGNOSIS — M25661 Stiffness of right knee, not elsewhere classified: Secondary | ICD-10-CM | POA: Diagnosis not present

## 2022-06-22 DIAGNOSIS — M6281 Muscle weakness (generalized): Secondary | ICD-10-CM | POA: Diagnosis not present

## 2022-06-22 DIAGNOSIS — M1711 Unilateral primary osteoarthritis, right knee: Secondary | ICD-10-CM | POA: Diagnosis not present

## 2022-06-22 DIAGNOSIS — Z96651 Presence of right artificial knee joint: Secondary | ICD-10-CM | POA: Diagnosis not present

## 2022-07-26 DIAGNOSIS — E782 Mixed hyperlipidemia: Secondary | ICD-10-CM | POA: Diagnosis not present

## 2022-07-26 DIAGNOSIS — M545 Low back pain, unspecified: Secondary | ICD-10-CM | POA: Diagnosis not present

## 2022-07-26 DIAGNOSIS — I10 Essential (primary) hypertension: Secondary | ICD-10-CM | POA: Diagnosis not present

## 2022-09-06 DIAGNOSIS — E559 Vitamin D deficiency, unspecified: Secondary | ICD-10-CM | POA: Diagnosis not present

## 2022-09-06 DIAGNOSIS — I10 Essential (primary) hypertension: Secondary | ICD-10-CM | POA: Diagnosis not present

## 2022-09-06 DIAGNOSIS — E1169 Type 2 diabetes mellitus with other specified complication: Secondary | ICD-10-CM | POA: Diagnosis not present

## 2022-09-06 DIAGNOSIS — E782 Mixed hyperlipidemia: Secondary | ICD-10-CM | POA: Diagnosis not present

## 2022-09-06 DIAGNOSIS — E785 Hyperlipidemia, unspecified: Secondary | ICD-10-CM | POA: Diagnosis not present

## 2022-09-06 DIAGNOSIS — R7303 Prediabetes: Secondary | ICD-10-CM | POA: Diagnosis not present

## 2022-09-06 DIAGNOSIS — Z6837 Body mass index (BMI) 37.0-37.9, adult: Secondary | ICD-10-CM | POA: Diagnosis not present

## 2022-10-13 IMAGING — DX DG KNEE 1-2V PORT*R*
2 series · 2 of 2 positions shown · non-contrast
Comparison: 11/25/2021.

CLINICAL DATA: Postop knee replacement.

EXAM:
PORTABLE RIGHT KNEE - 1-2 VIEW

[knee ap]
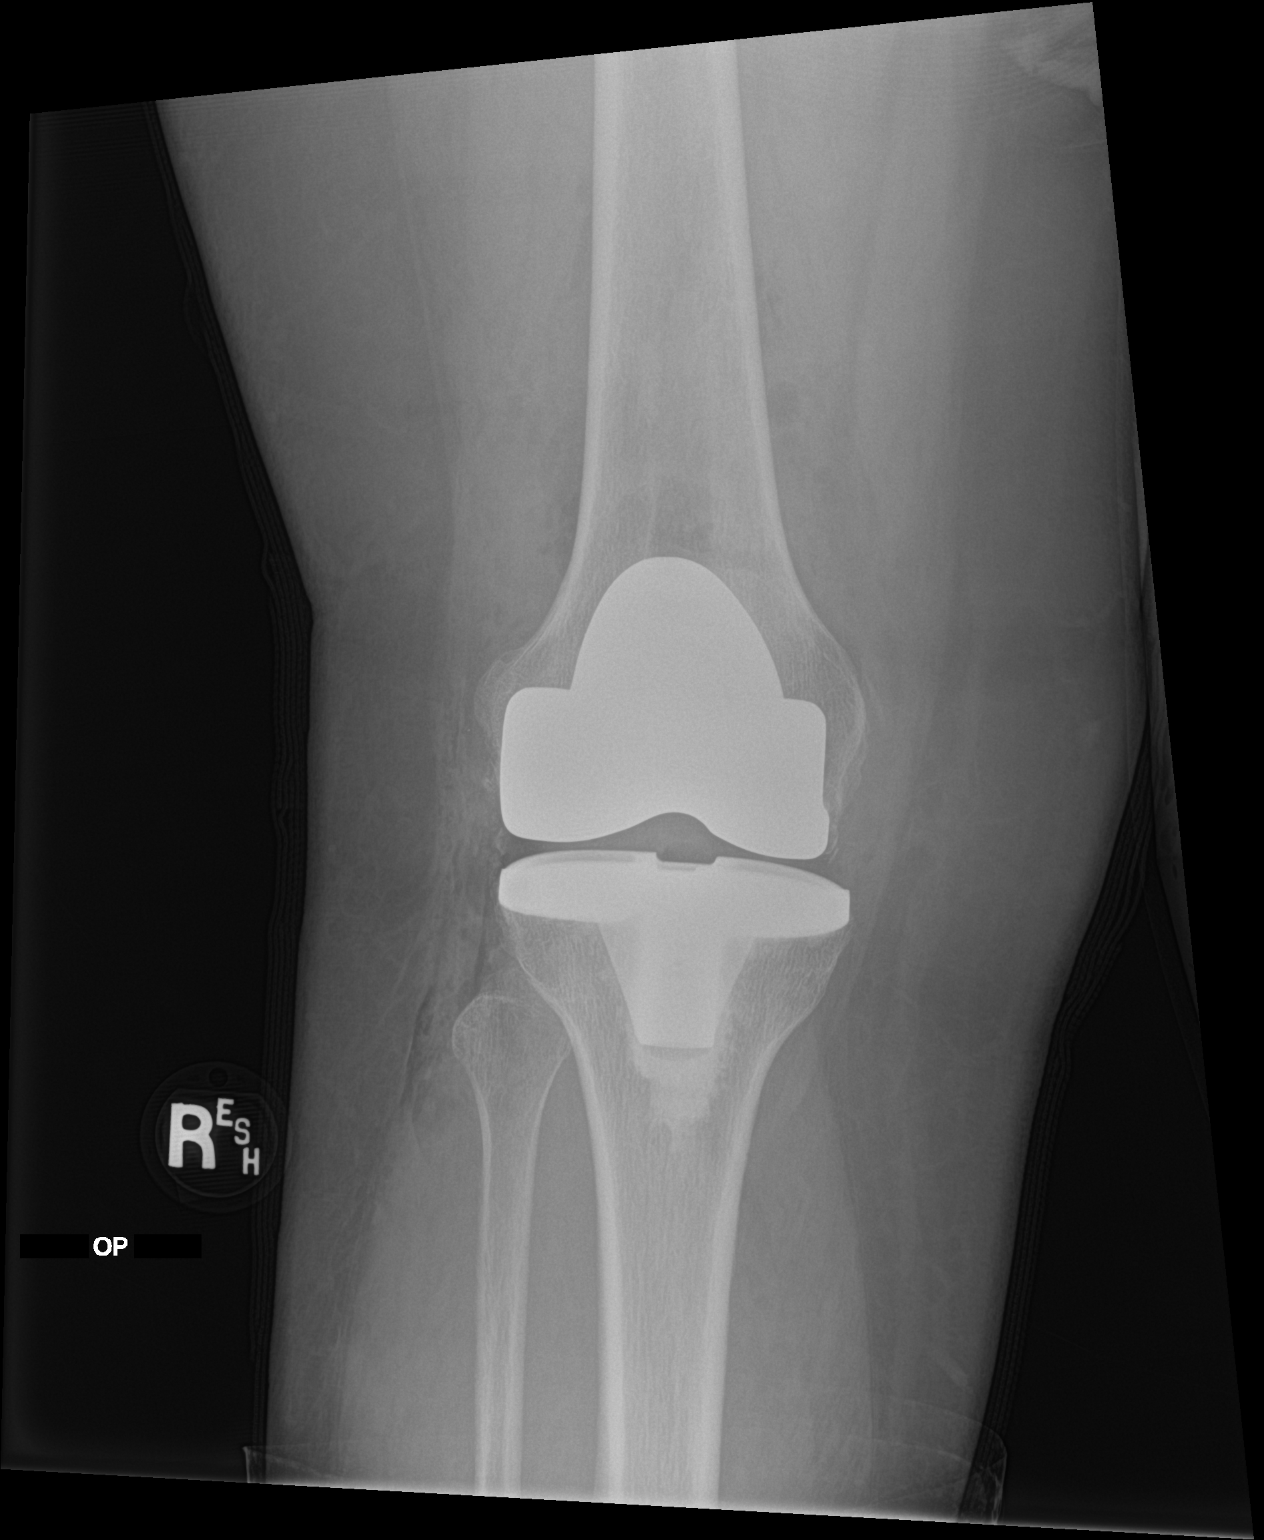

[knee lat]
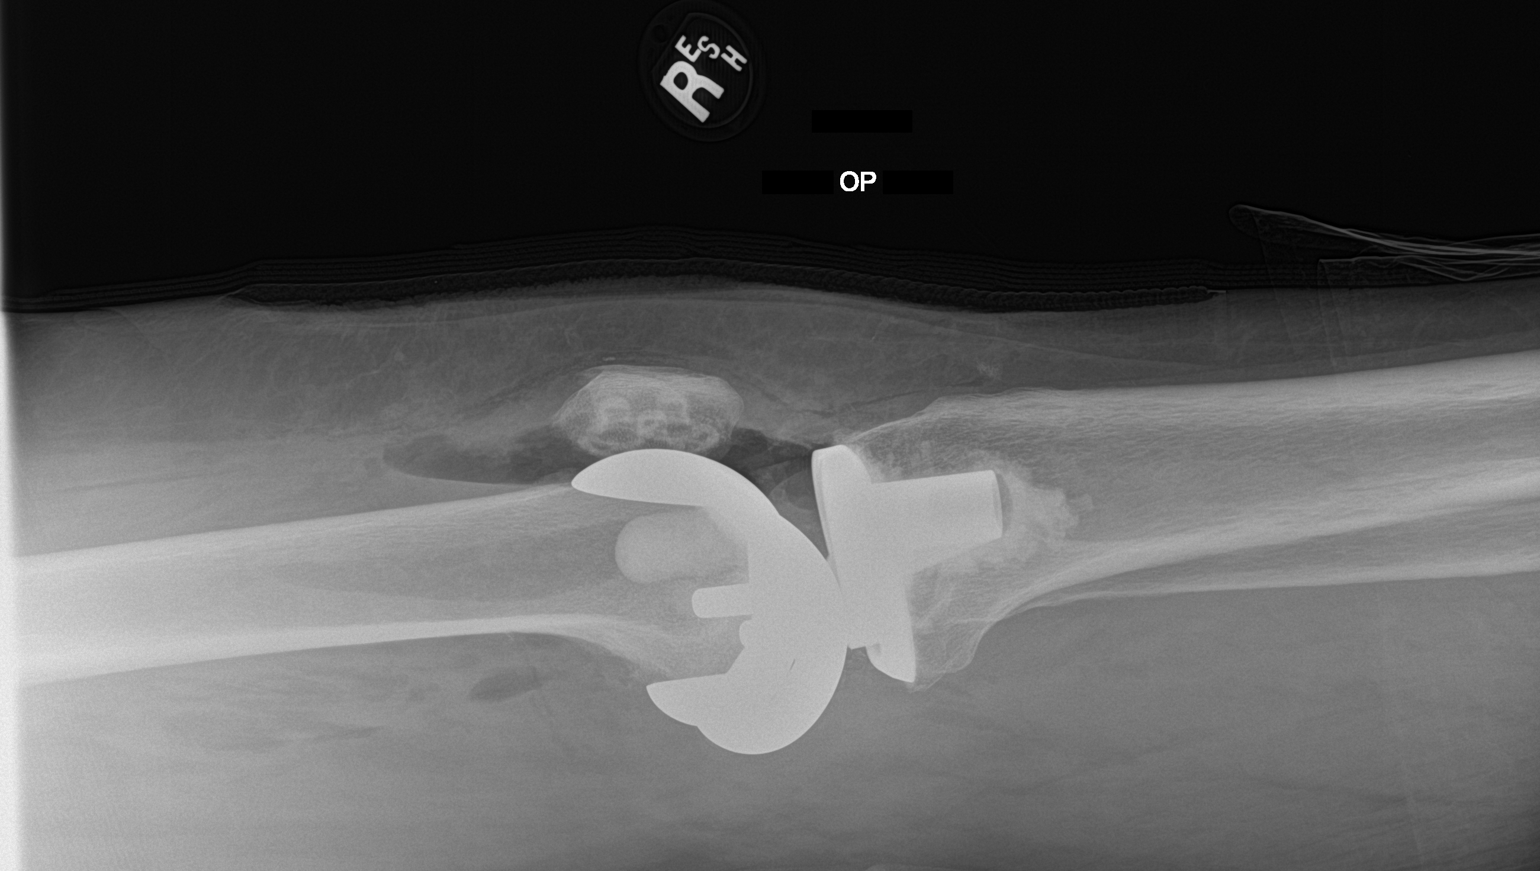

[2 of 2 positions shown; findings below may reference images not displayed]

FINDINGS: Knee prosthetic components appear well seated and aligned. No acute
fracture or evidence of an operative complication.
IMPRESSION: Well-positioned total right knee arthroplasty.

## 2022-10-19 DIAGNOSIS — M1711 Unilateral primary osteoarthritis, right knee: Secondary | ICD-10-CM | POA: Diagnosis not present

## 2022-11-15 DIAGNOSIS — Z1231 Encounter for screening mammogram for malignant neoplasm of breast: Secondary | ICD-10-CM | POA: Diagnosis not present

## 2022-12-12 DIAGNOSIS — E781 Pure hyperglyceridemia: Secondary | ICD-10-CM | POA: Diagnosis not present

## 2022-12-12 DIAGNOSIS — E782 Mixed hyperlipidemia: Secondary | ICD-10-CM | POA: Diagnosis not present

## 2022-12-12 DIAGNOSIS — M1000 Idiopathic gout, unspecified site: Secondary | ICD-10-CM | POA: Diagnosis not present

## 2022-12-12 DIAGNOSIS — E1169 Type 2 diabetes mellitus with other specified complication: Secondary | ICD-10-CM | POA: Diagnosis not present

## 2022-12-12 DIAGNOSIS — I10 Essential (primary) hypertension: Secondary | ICD-10-CM | POA: Diagnosis not present

## 2022-12-12 DIAGNOSIS — E559 Vitamin D deficiency, unspecified: Secondary | ICD-10-CM | POA: Diagnosis not present

## 2023-04-02 DIAGNOSIS — C50911 Malignant neoplasm of unspecified site of right female breast: Secondary | ICD-10-CM | POA: Diagnosis not present

## 2023-04-13 DIAGNOSIS — Z6838 Body mass index (BMI) 38.0-38.9, adult: Secondary | ICD-10-CM | POA: Diagnosis not present

## 2023-04-13 DIAGNOSIS — E1169 Type 2 diabetes mellitus with other specified complication: Secondary | ICD-10-CM | POA: Diagnosis not present

## 2023-04-13 DIAGNOSIS — I10 Essential (primary) hypertension: Secondary | ICD-10-CM | POA: Diagnosis not present

## 2023-04-13 DIAGNOSIS — E7800 Pure hypercholesterolemia, unspecified: Secondary | ICD-10-CM | POA: Diagnosis not present

## 2023-04-13 DIAGNOSIS — E785 Hyperlipidemia, unspecified: Secondary | ICD-10-CM | POA: Diagnosis not present

## 2023-04-13 DIAGNOSIS — M1000 Idiopathic gout, unspecified site: Secondary | ICD-10-CM | POA: Diagnosis not present

## 2023-05-01 DIAGNOSIS — R29890 Loss of height: Secondary | ICD-10-CM | POA: Diagnosis not present

## 2023-05-01 DIAGNOSIS — M8588 Other specified disorders of bone density and structure, other site: Secondary | ICD-10-CM | POA: Diagnosis not present

## 2023-05-01 DIAGNOSIS — Z853 Personal history of malignant neoplasm of breast: Secondary | ICD-10-CM | POA: Diagnosis not present

## 2023-08-21 DIAGNOSIS — E1169 Type 2 diabetes mellitus with other specified complication: Secondary | ICD-10-CM | POA: Diagnosis not present

## 2023-08-21 DIAGNOSIS — R635 Abnormal weight gain: Secondary | ICD-10-CM | POA: Diagnosis not present

## 2023-08-21 DIAGNOSIS — L209 Atopic dermatitis, unspecified: Secondary | ICD-10-CM | POA: Diagnosis not present

## 2023-08-21 DIAGNOSIS — I10 Essential (primary) hypertension: Secondary | ICD-10-CM | POA: Diagnosis not present

## 2023-08-22 ENCOUNTER — Other Ambulatory Visit: Payer: Self-pay

## 2023-08-23 ENCOUNTER — Ambulatory Visit
Admission: RE | Admit: 2023-08-23 | Discharge: 2023-08-23 | Disposition: A | Discharge status: Home / Self Care | Payer: Medicare PPO | Source: Ambulatory Visit | Attending: Family Medicine | Admitting: Family Medicine

## 2023-08-23 ENCOUNTER — Other Ambulatory Visit: Payer: Self-pay | Admitting: Family Medicine

## 2023-08-23 DIAGNOSIS — M79641 Pain in right hand: Secondary | ICD-10-CM

## 2023-08-23 DIAGNOSIS — M79642 Pain in left hand: Secondary | ICD-10-CM

## 2023-08-23 DIAGNOSIS — M25532 Pain in left wrist: Secondary | ICD-10-CM | POA: Diagnosis not present

## 2023-08-23 DIAGNOSIS — M25531 Pain in right wrist: Secondary | ICD-10-CM | POA: Diagnosis not present

## 2023-09-18 DIAGNOSIS — R739 Hyperglycemia, unspecified: Secondary | ICD-10-CM | POA: Diagnosis not present

## 2023-09-18 DIAGNOSIS — I10 Essential (primary) hypertension: Secondary | ICD-10-CM | POA: Diagnosis not present

## 2023-09-18 DIAGNOSIS — E782 Mixed hyperlipidemia: Secondary | ICD-10-CM | POA: Diagnosis not present

## 2023-09-18 DIAGNOSIS — E785 Hyperlipidemia, unspecified: Secondary | ICD-10-CM | POA: Diagnosis not present

## 2023-11-29 DIAGNOSIS — Z1231 Encounter for screening mammogram for malignant neoplasm of breast: Secondary | ICD-10-CM | POA: Diagnosis not present

## 2023-12-25 DIAGNOSIS — E7800 Pure hypercholesterolemia, unspecified: Secondary | ICD-10-CM | POA: Diagnosis not present

## 2023-12-25 DIAGNOSIS — I10 Essential (primary) hypertension: Secondary | ICD-10-CM | POA: Diagnosis not present

## 2023-12-25 DIAGNOSIS — R21 Rash and other nonspecific skin eruption: Secondary | ICD-10-CM | POA: Diagnosis not present

## 2023-12-25 DIAGNOSIS — E782 Mixed hyperlipidemia: Secondary | ICD-10-CM | POA: Diagnosis not present

## 2023-12-25 DIAGNOSIS — Z6839 Body mass index (BMI) 39.0-39.9, adult: Secondary | ICD-10-CM | POA: Diagnosis not present

## 2023-12-25 DIAGNOSIS — R7309 Other abnormal glucose: Secondary | ICD-10-CM | POA: Diagnosis not present

## 2024-01-15 DIAGNOSIS — I10 Essential (primary) hypertension: Secondary | ICD-10-CM | POA: Diagnosis not present

## 2024-02-19 DIAGNOSIS — R7303 Prediabetes: Secondary | ICD-10-CM | POA: Diagnosis not present

## 2024-04-30 DIAGNOSIS — H25811 Combined forms of age-related cataract, right eye: Secondary | ICD-10-CM | POA: Diagnosis not present

## 2024-05-23 DIAGNOSIS — H25811 Combined forms of age-related cataract, right eye: Secondary | ICD-10-CM | POA: Diagnosis not present

## 2024-06-11 DIAGNOSIS — E1169 Type 2 diabetes mellitus with other specified complication: Secondary | ICD-10-CM | POA: Diagnosis not present

## 2024-06-11 DIAGNOSIS — I10 Essential (primary) hypertension: Secondary | ICD-10-CM | POA: Diagnosis not present

## 2024-06-11 DIAGNOSIS — R7309 Other abnormal glucose: Secondary | ICD-10-CM | POA: Diagnosis not present

## 2024-06-11 DIAGNOSIS — E785 Hyperlipidemia, unspecified: Secondary | ICD-10-CM | POA: Diagnosis not present

## 2024-06-11 DIAGNOSIS — R7303 Prediabetes: Secondary | ICD-10-CM | POA: Diagnosis not present

## 2024-06-11 DIAGNOSIS — E782 Mixed hyperlipidemia: Secondary | ICD-10-CM | POA: Diagnosis not present

## 2024-06-11 DIAGNOSIS — E559 Vitamin D deficiency, unspecified: Secondary | ICD-10-CM | POA: Diagnosis not present

## 2024-06-11 DIAGNOSIS — Z6839 Body mass index (BMI) 39.0-39.9, adult: Secondary | ICD-10-CM | POA: Diagnosis not present

## 2024-06-19 DIAGNOSIS — E785 Hyperlipidemia, unspecified: Secondary | ICD-10-CM | POA: Diagnosis not present

## 2024-06-19 DIAGNOSIS — I10 Essential (primary) hypertension: Secondary | ICD-10-CM | POA: Diagnosis not present

## 2024-06-19 DIAGNOSIS — E559 Vitamin D deficiency, unspecified: Secondary | ICD-10-CM | POA: Diagnosis not present

## 2024-06-19 DIAGNOSIS — E782 Mixed hyperlipidemia: Secondary | ICD-10-CM | POA: Diagnosis not present

## 2024-06-19 DIAGNOSIS — E1165 Type 2 diabetes mellitus with hyperglycemia: Secondary | ICD-10-CM | POA: Diagnosis not present

## 2024-06-25 DIAGNOSIS — H2512 Age-related nuclear cataract, left eye: Secondary | ICD-10-CM | POA: Diagnosis not present

## 2024-06-27 DIAGNOSIS — H25812 Combined forms of age-related cataract, left eye: Secondary | ICD-10-CM | POA: Diagnosis not present

## 2024-11-14 ENCOUNTER — Encounter (HOSPITAL_COMMUNITY): Payer: Self-pay

## 2024-11-14 ENCOUNTER — Ambulatory Visit (HOSPITAL_COMMUNITY): Admission: EM | Admit: 2024-11-14 | Discharge: 2024-11-14 | Disposition: A | Discharge status: Home / Self Care

## 2024-11-14 DIAGNOSIS — J069 Acute upper respiratory infection, unspecified: Secondary | ICD-10-CM | POA: Diagnosis not present

## 2024-11-14 DIAGNOSIS — L03213 Periorbital cellulitis: Secondary | ICD-10-CM | POA: Diagnosis not present

## 2024-11-14 DIAGNOSIS — H1031 Unspecified acute conjunctivitis, right eye: Secondary | ICD-10-CM | POA: Diagnosis not present

## 2024-11-14 MED ORDER — ERYTHROMYCIN 5 MG/GM OP OINT: TOPICAL_OINTMENT | OPHTHALMIC | 0 refills | Status: AC

## 2024-11-14 MED ORDER — AMOXICILLIN-POT CLAVULANATE 875-125 MG PO TABS: 1.0000 | ORAL_TABLET | Freq: Two times a day (BID) | ORAL | 0 refills | Status: AC

## 2024-11-14 NOTE — ED Triage Notes (Signed)
 Right eye swelling, draining, since Wednesday; pt states that she has had a cold since Tuesday

## 2024-11-14 NOTE — Discharge Instructions (Addendum)
 Symptoms and physical exam findings are most consistent with preseptal cellulitis of the right upper eyelid and conjunctivitis of the right eye.  We will treat this with both topical antibiotics as well as antibiotics by mouth.  We recommend the following: Augmentin 875 mg twice daily for 10 days.  This is an antibiotic.  Take this with food.  Erythromycin ointment apply a small amount into the eye twice daily for 10 days. May continue to use warm compresses to help with symptom relief May alternate Tylenol  and ibuprofen for pain/swelling Make sure to stay hydrated by drinking plenty of water . Return to urgent care or PCP if symptoms worsen or fail to resolve.

## 2024-11-14 NOTE — ED Provider Notes (Signed)
 " [facility]    CSN: [id number] Arrival date & time: 11/14/24  9046      History   Chief Complaint Chief Complaint  Patient presents with   Conjunctivitis    HPI Amy Guzman is a 69 y.o. female.   69 year old female presents urgent care with complaints of right eye redness, drainage, swelling and pain.  Her symptoms started on Wednesday.  This was preceded by sinus congestion and cold-like symptoms that started on Monday.  She reports that the redness and swelling in the eye has gotten much worse.  There is a fair amount of watering and drainage from the eye as well.  It is crusting over in the mornings.  She denies any fevers, chills, loss of vision, headache.   Conjunctivitis Pertinent negatives include no chest pain, no abdominal pain and no shortness of breath.    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   Abnormal feces 09/29/2021   Colon cancer screening 09/29/2021    Past Surgical History:  Procedure Laterality Date   BREAST SURGERY     CHALAZION EXCISION  08/09/2012   Procedure: MINOR EXCISION OF CHALAZION;  Surgeon: Debby FORBES Sharalyn Mickey., MD;  Location: [facility];  Service: Ophthalmology;  Laterality: Right;  Cyst Excision of the lower Lid Right Eye    COLONOSCOPY     LESION REMOVAL  08/30/2012   Procedure: MINOR EXICISION OF LESION;  Surgeon: Debby FORBES Sharalyn Mickey., MD;  Location: [facility];  Service: Ophthalmology;  Laterality: Left;   TOTAL KNEE ARTHROPLASTY Right 04/10/2022   Procedure: TOTAL KNEE ARTHROPLASTY;  Surgeon:  Edna Toribio LABOR, MD;  Location: [facility];  Service: Orthopedics;  Laterality: Right;    OB History     Gravida  4   Para  3   Term  3   Preterm      AB  1   Living  3      SAB      IAB      Ectopic  1   Multiple      Live Births  3            Home Medications    Prior to Admission medications  Medication Sig Start Date End Date Taking? Authorizing Provider  allopurinol  (ZYLOPRIM ) 100 MG tablet Take 100 mg by mouth daily.   Yes [provider]  amLODipine (NORVASC) 5 MG tablet  07/29/24  Yes [provider]  amoxicillin-clavulanate (AUGMENTIN) 875-125 MG tablet Take 1 tablet by mouth every 12 (twelve) hours for 10 days. 11/14/24 11/24/24 Yes Zamzam Whinery, Almarie LABOR, PA-C  Cholecalciferol (VITAMIN D3) 1.25 MG (50000 UT) CAPS Take 1 capsule by mouth once a week. 07/09/24  Yes [provider]  erythromycin ophthalmic ointment Place a 1/2 inch ribbon of ointment into the right lower eyelid for 10 days. 11/14/24  Yes Dametrius Sanjuan A, PA-C  JARDIANCE 25 MG TABS tablet Take 25 mg by mouth daily. 09/22/24  Yes [provider]  BELMA  0.6 MG CAPS Take 0.6 mg by mouth daily. 03/09/22  Yes [provider]  rosuvastatin  (CRESTOR ) 10 MG tablet Take 10 mg by mouth daily.   Yes [provider]  valsartan -hydrochlorothiazide  (DIOVAN -HCT) 80-12.5 MG tablet Take 1 tablet by mouth daily.   Yes [provider]  etodolac (LODINE) 400 MG tablet Take 400 mg by mouth 2 (two) times daily. 03/08/22   [provider]    Family History Family History  Problem Relation Age of Onset   Breast cancer Maternal Aunt     Social History Social History[1]   Allergies   Patient has no known allergies.   Review of Systems Review of Systems  Constitutional:  Negative for chills and fever.  HENT:  Negative for ear pain and sore throat.   Eyes:  Positive for photophobia, pain, discharge and redness. Negative for visual  disturbance.  Respiratory:  Negative for cough and shortness of breath.   Cardiovascular:  Negative for chest pain and palpitations.  Gastrointestinal:  Negative for abdominal pain and vomiting.  Genitourinary:  Negative for dysuria and hematuria.  Musculoskeletal:  Negative for arthralgias and back pain.  Skin:  Negative for color change and rash.  Neurological:  Negative for seizures and syncope.  All other systems reviewed and are negative.    Physical Exam Triage Vital Signs ED Triage Vitals  Encounter Vitals Group     BP 11/14/24 1008 132/72     Girls Systolic BP Percentile --      Girls Diastolic BP Percentile --      Boys Systolic BP Percentile --      Boys Diastolic BP Percentile --      Pulse Rate 11/14/24 1003 90     Resp 11/14/24 1003 18     Temp 11/14/24 1008 98.4 F (36.9 C)     Temp Source 11/14/24 1008 Oral     SpO2 11/14/24 1008 95 %     Weight --      Height --      Head Circumference --      Peak Flow --      Pain Score --      Pain Loc --      Pain Education --      Exclude from Growth Chart --    No data found.  Updated Vital Signs BP 132/72 (BP Location: Right Arm)   Pulse 89   Temp 98.4 F (36.9 C) (Oral)   Resp 18   SpO2 95%   Visual Acuity Right Eye Distance:   Left Eye Distance:   Bilateral Distance:    Right Eye Near:   Left Eye Near:    Bilateral Near:     Physical Exam Vitals and nursing note reviewed.  Constitutional:      General: She is not in acute distress.    Appearance: She is well-developed.  HENT:     Head: Normocephalic and atraumatic.  Eyes:     General:        Right eye: Discharge present.     Extraocular Movements:     Right eye: Normal extraocular motion.     Conjunctiva/sclera:     Right eye: Right conjunctiva is injected.     Pupils: Pupils are equal, round, and reactive to light.     Comments: Upper eyelid erythematous and edematous with the erythema spreading towards the eyebrow and slightly lateral   Cardiovascular:     Rate and Rhythm: Normal rate and regular rhythm.  Heart sounds: No murmur heard. Pulmonary:     Effort: Pulmonary effort is normal. No respiratory distress.     Breath sounds: Normal breath sounds.  Abdominal:     Palpations: Abdomen is soft.     Tenderness: There is no abdominal tenderness.  Musculoskeletal:        General: No swelling.     Cervical back: Neck supple.  Skin:    General: Skin is warm and dry.     Capillary Refill: Capillary refill takes less than 2 seconds.  Neurological:     Mental Status: She is alert.  Psychiatric:        Mood and Affect: Mood normal.      UC Treatments / Results  Labs (all labs ordered are listed, but only abnormal results are displayed) Labs Reviewed - No data to display  EKG   Radiology No results found.  Procedures Procedures (including critical care time)  Medications Ordered in UC Medications - No data to display  Initial Impression / Assessment and Plan / UC Course  I have reviewed the triage vital signs and the nursing notes.  Pertinent labs & imaging results that were available during my care of the patient were reviewed by me and considered in my medical decision making (see chart for details).     Preseptal cellulitis of right upper eyelid  Acute bacterial conjunctivitis of right eye  Viral upper respiratory infection   Symptoms and physical exam findings are most consistent with preseptal cellulitis of the right upper eyelid and conjunctivitis of the right eye.  We will treat this with both topical antibiotics as well as antibiotics by mouth.  We recommend the following: Augmentin 875 mg twice daily for 10 days.  This is an antibiotic.  Take this with food.  Erythromycin ointment apply a small amount into the eye twice daily for 10 days. May continue to use warm compresses to help with symptom relief May alternate Tylenol  and ibuprofen for pain/swelling Make sure to stay hydrated by  drinking plenty of water . Return to urgent care or PCP if symptoms worsen or fail to resolve.    Final Clinical Impressions(s) / UC Diagnoses   Final diagnoses:  Preseptal cellulitis of right upper eyelid  Acute bacterial conjunctivitis of right eye  Viral upper respiratory infection     Discharge Instructions      Symptoms and physical exam findings are most consistent with preseptal cellulitis of the right upper eyelid and conjunctivitis of the right eye.  We will treat this with both topical antibiotics as well as antibiotics by mouth.  We recommend the following: Augmentin 875 mg twice daily for 10 days.  This is an antibiotic.  Take this with food.  Erythromycin ointment apply a small amount into the eye twice daily for 10 days. May continue to use warm compresses to help with symptom relief May alternate Tylenol  and ibuprofen for pain/swelling Make sure to stay hydrated by drinking plenty of water . Return to urgent care or PCP if symptoms worsen or fail to resolve.      ED Prescriptions     Medication Sig Dispense Auth. Provider   amoxicillin-clavulanate (AUGMENTIN) 875-125 MG tablet Take 1 tablet by mouth every 12 (twelve) hours for 10 days. 20 tablet Luddie Boghosian A, PA-C   erythromycin ophthalmic ointment Place a 1/2 inch ribbon of ointment into the right lower eyelid for 10 days. 3.5 g Teresa Almarie LABOR, [place]      PDMP not reviewed this encounter.    [  1]  Social History Tobacco Use   Smoking status: Never   Smokeless tobacco: Never  Vaping Use   Vaping status: Never Used  Substance Use Topics   Alcohol  use: Yes    Alcohol /week: 0.0 standard drinks of alcohol     Comment: occas.   Drug use: No     Teresa Almarie LABOR, PA-C 11/14/24 1044  "

## 2024-11-26 ENCOUNTER — Encounter: Payer: Self-pay | Admitting: *Deleted

## 2024-11-26 NOTE — Progress Notes (Signed)
 Amy Guzman                                          MRN: [id number]   11/26/2024   The [facility] Quality Team Specialist reviewed this patient medical record for the purposes of chart review for care gap closure. The following were reviewed: chart review for care gap closure-controlling blood pressure.    [facility] Quality Team
